# Patient Record
Sex: Female | Born: 1940 | Race: White | Hispanic: No
Health system: Southern US, Community
[De-identification: ages and names within clinical notes are randomized; demographics above are authoritative.]

---

## 2004-12-13 ENCOUNTER — Ambulatory Visit: Payer: Self-pay | Admitting: Internal Medicine

## 2004-12-15 ENCOUNTER — Ambulatory Visit: Payer: Self-pay | Admitting: Internal Medicine

## 2005-06-20 ENCOUNTER — Ambulatory Visit: Payer: Self-pay | Admitting: Internal Medicine

## 2005-09-08 ENCOUNTER — Ambulatory Visit: Payer: Self-pay | Admitting: Otolaryngology

## 2005-12-20 ENCOUNTER — Ambulatory Visit: Payer: Self-pay | Admitting: Internal Medicine

## 2006-12-24 ENCOUNTER — Ambulatory Visit: Payer: Self-pay | Admitting: Internal Medicine

## 2007-02-15 ENCOUNTER — Ambulatory Visit: Payer: Self-pay | Admitting: Gastroenterology

## 2008-02-06 ENCOUNTER — Ambulatory Visit: Payer: Self-pay | Admitting: Internal Medicine

## 2009-05-17 ENCOUNTER — Ambulatory Visit: Payer: Self-pay | Admitting: Internal Medicine

## 2010-09-06 ENCOUNTER — Ambulatory Visit: Payer: Self-pay | Admitting: Internal Medicine

## 2011-11-27 ENCOUNTER — Ambulatory Visit: Payer: Self-pay | Admitting: Internal Medicine

## 2011-11-28 ENCOUNTER — Ambulatory Visit: Payer: Self-pay | Admitting: Internal Medicine

## 2011-12-06 ENCOUNTER — Ambulatory Visit: Payer: Self-pay | Admitting: Surgery

## 2011-12-07 LAB — PATHOLOGY REPORT

## 2011-12-20 ENCOUNTER — Ambulatory Visit: Payer: Self-pay | Admitting: Surgery

## 2012-01-01 ENCOUNTER — Ambulatory Visit: Payer: Self-pay | Admitting: Surgery

## 2012-01-03 LAB — PATHOLOGY REPORT

## 2012-01-19 ENCOUNTER — Ambulatory Visit: Payer: Self-pay | Admitting: Internal Medicine

## 2012-01-19 LAB — CBC CANCER CENTER
Eosinophil %: 3.2 %
HCT: 42.2 % (ref 35.0–47.0)
HGB: 13.9 g/dL (ref 12.0–16.0)
Lymphocyte #: 3.4 x10 3/mm (ref 1.0–3.6)
MCH: 29.4 pg (ref 26.0–34.0)
Monocyte %: 6.3 %
Neutrophil #: 3.8 x10 3/mm (ref 1.4–6.5)
Neutrophil %: 47.3 %
Platelet: 266 x10 3/mm (ref 150–440)
RBC: 4.72 10*6/uL (ref 3.80–5.20)
RDW: 12.8 % (ref 11.5–14.5)

## 2012-01-19 LAB — BASIC METABOLIC PANEL
Chloride: 101 mmol/L (ref 98–107)
Co2: 31 mmol/L (ref 21–32)
Creatinine: 0.84 mg/dL (ref 0.60–1.30)
Osmolality: 279 (ref 275–301)
Potassium: 4.1 mmol/L (ref 3.5–5.1)

## 2012-01-24 LAB — CANCER ANTIGEN 27.29: CA 27.29: 34.2 U/mL (ref 0.0–38.6)

## 2012-02-02 ENCOUNTER — Ambulatory Visit: Payer: Self-pay | Admitting: Surgery

## 2012-02-03 ENCOUNTER — Ambulatory Visit: Payer: Self-pay | Admitting: Internal Medicine

## 2012-02-05 LAB — BASIC METABOLIC PANEL
Anion Gap: 5 — ABNORMAL LOW (ref 7–16)
BUN: 19 mg/dL — ABNORMAL HIGH (ref 7–18)
Calcium, Total: 9.3 mg/dL (ref 8.5–10.1)
Chloride: 104 mmol/L (ref 98–107)
Co2: 31 mmol/L (ref 21–32)
Creatinine: 0.75 mg/dL (ref 0.60–1.30)
EGFR (African American): >60
EGFR (Non-African Amer.): >60
Osmolality: 281 (ref 275–301)
Potassium: 3.9 mmol/L (ref 3.5–5.1)
Sodium: 140 mmol/L (ref 136–145)

## 2012-02-05 LAB — CBC CANCER CENTER
Basophil #: 0 x10 3/mm (ref 0.0–0.1)
HCT: 38.2 % (ref 35.0–47.0)
Lymphocyte #: 2.3 x10 3/mm (ref 1.0–3.6)
MCH: 29.5 pg (ref 26.0–34.0)
MCHC: 33.1 g/dL (ref 32.0–36.0)
MCV: 89 fL (ref 80–100)
Monocyte #: 0.4 x10 3/mm (ref 0.2–0.9)
Monocyte %: 7.7 %
Neutrophil #: 2.6 x10 3/mm (ref 1.4–6.5)
RDW: 13.5 % (ref 11.5–14.5)

## 2012-02-12 LAB — CBC CANCER CENTER
HCT: 36.8 % (ref 35.0–47.0)
Lymphocyte %: 37 %
MCHC: 33.3 g/dL (ref 32.0–36.0)
MCV: 89 fL (ref 80–100)
Monocyte %: 5.8 %
Neutrophil #: 1.3 x10 3/mm — ABNORMAL LOW (ref 1.4–6.5)
Neutrophil %: 45.8 %
RDW: 13.3 % (ref 11.5–14.5)
WBC: 2.9 x10 3/mm — ABNORMAL LOW (ref 3.6–11.0)

## 2012-02-19 LAB — CBC CANCER CENTER
Basophil #: 0.1 x10 3/mm (ref 0.0–0.1)
Basophil %: 1 %
Eosinophil #: 0 x10 3/mm (ref 0.0–0.7)
Eosinophil %: 0.5 %
HCT: 38.7 % (ref 35.0–47.0)
HGB: 12.8 g/dL (ref 12.0–16.0)
Lymphocyte #: 2.6 x10 3/mm (ref 1.0–3.6)
Lymphocyte %: 28.9 %
MCH: 29.3 pg (ref 26.0–34.0)
MCHC: 33 g/dL (ref 32.0–36.0)
MCV: 89 fL (ref 80–100)
Monocyte #: 0.6 x10 3/mm (ref 0.2–0.9)
Monocyte %: 7 %
Neutrophil #: 5.5 x10 3/mm (ref 1.4–6.5)
Neutrophil %: 62.6 %
Platelet: 208 x10 3/mm (ref 150–440)
RBC: 4.36 10*6/uL (ref 3.80–5.20)
RDW: 13 % (ref 11.5–14.5)
WBC: 8.8 x10 3/mm (ref 3.6–11.0)

## 2012-02-19 LAB — HEPATIC FUNCTION PANEL A (ARMC)
Albumin: 3.7 g/dL (ref 3.4–5.0)
Alkaline Phosphatase: 132 U/L (ref 50–136)
Bilirubin, Direct: 0.1 mg/dL (ref 0.00–0.20)
Bilirubin,Total: 0.2 mg/dL (ref 0.2–1.0)
SGOT(AST): 18 U/L (ref 15–37)
SGPT (ALT): 38 U/L
Total Protein: 7.2 g/dL (ref 6.4–8.2)

## 2012-02-19 LAB — BASIC METABOLIC PANEL
BUN: 14 mg/dL (ref 7–18)
Calcium, Total: 9.4 mg/dL (ref 8.5–10.1)
Chloride: 102 mmol/L (ref 98–107)
Co2: 30 mmol/L (ref 21–32)
EGFR (African American): >60
Glucose: 91 mg/dL (ref 65–99)
Osmolality: 276 (ref 275–301)
Potassium: 4.1 mmol/L (ref 3.5–5.1)
Sodium: 138 mmol/L (ref 136–145)

## 2012-02-26 LAB — CBC CANCER CENTER
Basophil %: 2 %
Eosinophil #: 0 x10 3/mm (ref 0.0–0.7)
Eosinophil %: 0.3 %
HCT: 37.6 % (ref 35.0–47.0)
HGB: 12.6 g/dL (ref 12.0–16.0)
Lymphocyte #: 2.3 x10 3/mm (ref 1.0–3.6)
Lymphocyte %: 36.6 %
MCH: 29.6 pg (ref 26.0–34.0)
MCV: 88 fL (ref 80–100)
Monocyte #: 0.6 x10 3/mm (ref 0.2–0.9)
Monocyte %: 10.2 %
Neutrophil #: 3.2 x10 3/mm (ref 1.4–6.5)
Neutrophil %: 50.9 %
Platelet: 344 x10 3/mm (ref 150–440)
RDW: 13.6 % (ref 11.5–14.5)
WBC: 6.3 x10 3/mm (ref 3.6–11.0)

## 2012-03-04 ENCOUNTER — Ambulatory Visit: Payer: Self-pay | Admitting: Internal Medicine

## 2012-03-04 LAB — CBC CANCER CENTER
Basophil #: 0 x10 3/mm (ref 0.0–0.1)
Eosinophil #: 0.1 x10 3/mm (ref 0.0–0.7)
HCT: 34.5 % — ABNORMAL LOW (ref 35.0–47.0)
HGB: 11.4 g/dL — ABNORMAL LOW (ref 12.0–16.0)
Lymphocyte #: 1.1 x10 3/mm (ref 1.0–3.6)
MCH: 29.5 pg (ref 26.0–34.0)
MCHC: 33.1 g/dL (ref 32.0–36.0)
MCV: 89 fL (ref 80–100)
Monocyte #: 0.2 x10 3/mm (ref 0.2–0.9)
Monocyte %: 3.6 %
Neutrophil #: 3.3 x10 3/mm (ref 1.4–6.5)
RDW: 13.6 % (ref 11.5–14.5)
WBC: 4.6 x10 3/mm (ref 3.6–11.0)

## 2012-03-11 LAB — BASIC METABOLIC PANEL
Anion Gap: 6 — ABNORMAL LOW (ref 7–16)
Calcium, Total: 9.6 mg/dL (ref 8.5–10.1)
Chloride: 101 mmol/L (ref 98–107)
Co2: 31 mmol/L (ref 21–32)
Creatinine: 0.92 mg/dL (ref 0.60–1.30)
EGFR (Non-African Amer.): >60
Osmolality: 275 (ref 275–301)
Sodium: 138 mmol/L (ref 136–145)

## 2012-03-11 LAB — HEPATIC FUNCTION PANEL A (ARMC)
Alkaline Phosphatase: 132 U/L (ref 50–136)
Bilirubin, Direct: 0.1 mg/dL (ref 0.00–0.20)
SGPT (ALT): 29 U/L

## 2012-03-11 LAB — CBC CANCER CENTER
Basophil %: 1.2 %
Eosinophil #: 0.1 x10 3/mm (ref 0.0–0.7)
Eosinophil %: 1.7 %
HCT: 36.2 % (ref 35.0–47.0)
HGB: 12.2 g/dL (ref 12.0–16.0)
MCH: 30 pg (ref 26.0–34.0)
MCHC: 33.7 g/dL (ref 32.0–36.0)
Monocyte #: 0.8 x10 3/mm (ref 0.2–0.9)
Neutrophil #: 5.5 x10 3/mm (ref 1.4–6.5)
Neutrophil %: 64.3 %
RBC: 4.08 10*6/uL (ref 3.80–5.20)
RDW: 13.7 % (ref 11.5–14.5)

## 2012-03-18 LAB — CBC CANCER CENTER
Eosinophil %: 0.7 %
HCT: 35.9 % (ref 35.0–47.0)
HGB: 11.9 g/dL — ABNORMAL LOW (ref 12.0–16.0)
Lymphocyte %: 22.8 %
MCV: 90 fL (ref 80–100)
Monocyte #: 0.7 x10 3/mm (ref 0.2–0.9)
Monocyte %: 11.2 %
Neutrophil %: 62.7 %
Platelet: 305 x10 3/mm (ref 150–440)
RBC: 3.98 10*6/uL (ref 3.80–5.20)
WBC: 5.9 x10 3/mm (ref 3.6–11.0)

## 2012-03-25 LAB — CBC CANCER CENTER
Basophil %: 0.5 %
Eosinophil %: 2.3 %
HCT: 34.9 % — ABNORMAL LOW (ref 35.0–47.0)
HGB: 11.5 g/dL — ABNORMAL LOW (ref 12.0–16.0)
Lymphocyte #: 1 x10 3/mm (ref 1.0–3.6)
MCH: 30 pg (ref 26.0–34.0)
MCHC: 32.9 g/dL (ref 32.0–36.0)
Monocyte #: 0.2 x10 3/mm (ref 0.2–0.9)
Monocyte %: 5.2 %
Neutrophil #: 2.9 x10 3/mm (ref 1.4–6.5)
Neutrophil %: 69.3 %
Platelet: 204 x10 3/mm (ref 150–440)
RBC: 3.83 10*6/uL (ref 3.80–5.20)

## 2012-04-03 LAB — CBC CANCER CENTER
Basophil #: 0.2 x10 3/mm — ABNORMAL HIGH (ref 0.0–0.1)
Basophil %: 2.4 %
Eosinophil #: 0.1 x10 3/mm (ref 0.0–0.7)
Eosinophil %: 2 %
HCT: 33.4 % — ABNORMAL LOW (ref 35.0–47.0)
HGB: 11.4 g/dL — ABNORMAL LOW (ref 12.0–16.0)
Lymphocyte #: 1.3 x10 3/mm (ref 1.0–3.6)
MCH: 31.1 pg (ref 26.0–34.0)
MCHC: 34.1 g/dL (ref 32.0–36.0)
MCV: 91 fL (ref 80–100)
Neutrophil #: 4.5 x10 3/mm (ref 1.4–6.5)
RBC: 3.66 10*6/uL — ABNORMAL LOW (ref 3.80–5.20)

## 2012-04-03 LAB — BASIC METABOLIC PANEL
BUN: 12 mg/dL (ref 7–18)
Calcium, Total: 9.1 mg/dL (ref 8.5–10.1)
Chloride: 103 mmol/L (ref 98–107)
Co2: 31 mmol/L (ref 21–32)
EGFR (African American): >60
Glucose: 90 mg/dL (ref 65–99)
Osmolality: 281 (ref 275–301)
Potassium: 4 mmol/L (ref 3.5–5.1)
Sodium: 141 mmol/L (ref 136–145)

## 2012-04-03 LAB — HEPATIC FUNCTION PANEL A (ARMC)
Alkaline Phosphatase: 109 U/L (ref 50–136)
Bilirubin, Direct: 0.1 mg/dL (ref 0.00–0.20)
Bilirubin,Total: 0.3 mg/dL (ref 0.2–1.0)
Total Protein: 6.6 g/dL (ref 6.4–8.2)

## 2012-04-04 ENCOUNTER — Ambulatory Visit: Payer: Self-pay | Admitting: Internal Medicine

## 2012-04-08 LAB — CBC CANCER CENTER
Basophil #: 0.2 x10 3/mm — ABNORMAL HIGH (ref 0.0–0.1)
Eosinophil #: 0 x10 3/mm (ref 0.0–0.7)
Eosinophil %: 0.9 %
Lymphocyte #: 1.4 x10 3/mm (ref 1.0–3.6)
Lymphocyte %: 28.7 %
MCH: 31.5 pg (ref 26.0–34.0)
MCHC: 34.5 g/dL (ref 32.0–36.0)
MCV: 91 fL (ref 80–100)
Monocyte #: 0.5 x10 3/mm (ref 0.2–0.9)
Platelet: 350 x10 3/mm (ref 150–440)
RDW: 17.4 % — ABNORMAL HIGH (ref 11.5–14.5)

## 2012-04-08 LAB — COMPREHENSIVE METABOLIC PANEL
Alkaline Phosphatase: 97 U/L (ref 50–136)
BUN: 18 mg/dL (ref 7–18)
Calcium, Total: 9.3 mg/dL (ref 8.5–10.1)
Co2: 28 mmol/L (ref 21–32)
EGFR (Non-African Amer.): >60
Glucose: 76 mg/dL (ref 65–99)
Osmolality: 280 (ref 275–301)
Sodium: 140 mmol/L (ref 136–145)

## 2012-04-15 LAB — CBC CANCER CENTER
Basophil #: 0 x10 3/mm (ref 0.0–0.1)
Eosinophil #: 0.1 x10 3/mm (ref 0.0–0.7)
Eosinophil %: 2.6 %
HCT: 31.6 % — ABNORMAL LOW (ref 35.0–47.0)
Lymphocyte #: 0.9 x10 3/mm — ABNORMAL LOW (ref 1.0–3.6)
MCHC: 34.6 g/dL (ref 32.0–36.0)
MCV: 92 fL (ref 80–100)
Neutrophil #: 3.6 x10 3/mm (ref 1.4–6.5)
Platelet: 186 x10 3/mm (ref 150–440)
RDW: 16.7 % — ABNORMAL HIGH (ref 11.5–14.5)

## 2012-04-22 LAB — CBC CANCER CENTER
Basophil #: 0.1 x10 3/mm (ref 0.0–0.1)
Basophil %: 1.5 %
Eosinophil #: 0.2 x10 3/mm (ref 0.0–0.7)
HCT: 34.7 % — ABNORMAL LOW (ref 35.0–47.0)
Lymphocyte #: 1.5 x10 3/mm (ref 1.0–3.6)
Lymphocyte %: 17.1 %
MCH: 30.6 pg (ref 26.0–34.0)
MCHC: 32.8 g/dL (ref 32.0–36.0)
MCV: 93 fL (ref 80–100)
Monocyte #: 0.9 x10 3/mm (ref 0.2–0.9)
Neutrophil #: 6 x10 3/mm (ref 1.4–6.5)
Platelet: 245 x10 3/mm (ref 150–440)
RBC: 3.72 10*6/uL — ABNORMAL LOW (ref 3.80–5.20)
RDW: 17.4 % — ABNORMAL HIGH (ref 11.5–14.5)

## 2012-04-22 LAB — HEPATIC FUNCTION PANEL A (ARMC)
Albumin: 3.7 g/dL (ref 3.4–5.0)
Alkaline Phosphatase: 128 U/L (ref 50–136)
SGOT(AST): 11 U/L — ABNORMAL LOW (ref 15–37)
SGPT (ALT): 30 U/L (ref 12–78)
Total Protein: 6.9 g/dL (ref 6.4–8.2)

## 2012-04-22 LAB — BASIC METABOLIC PANEL
Anion Gap: 5 — ABNORMAL LOW (ref 7–16)
BUN: 13 mg/dL (ref 7–18)
Co2: 33 mmol/L — ABNORMAL HIGH (ref 21–32)
EGFR (African American): >60
EGFR (Non-African Amer.): >60
Osmolality: 279 (ref 275–301)
Sodium: 140 mmol/L (ref 136–145)

## 2012-04-29 LAB — CBC CANCER CENTER
Basophil #: 0 x10 3/mm (ref 0.0–0.1)
Eosinophil #: 0 x10 3/mm (ref 0.0–0.7)
HGB: 11.8 g/dL — ABNORMAL LOW (ref 12.0–16.0)
Lymphocyte %: 9.5 %
MCH: 31.4 pg (ref 26.0–34.0)
MCHC: 33.7 g/dL (ref 32.0–36.0)
Monocyte %: 0.5 %
Neutrophil #: 5.2 x10 3/mm (ref 1.4–6.5)
Neutrophil %: 89.7 %
Platelet: 353 x10 3/mm (ref 150–440)
RDW: 17.6 % — ABNORMAL HIGH (ref 11.5–14.5)
WBC: 5.7 x10 3/mm (ref 3.6–11.0)

## 2012-05-05 ENCOUNTER — Ambulatory Visit: Payer: Self-pay | Admitting: Internal Medicine

## 2012-05-07 LAB — CBC CANCER CENTER
Basophil #: 0 x10 3/mm (ref 0.0–0.1)
HCT: 34.2 % — ABNORMAL LOW (ref 35.0–47.0)
HGB: 11.3 g/dL — ABNORMAL LOW (ref 12.0–16.0)
Lymphocyte #: 0.6 x10 3/mm — ABNORMAL LOW (ref 1.0–3.6)
Lymphocyte %: 7.1 %
Monocyte %: 1.1 %
Neutrophil #: 7.5 x10 3/mm — ABNORMAL HIGH (ref 1.4–6.5)
Platelet: 294 x10 3/mm (ref 150–440)
RDW: 16.5 % — ABNORMAL HIGH (ref 11.5–14.5)
WBC: 8.2 x10 3/mm (ref 3.6–11.0)

## 2012-05-13 LAB — BASIC METABOLIC PANEL
Anion Gap: 7 (ref 7–16)
Calcium, Total: 9.3 mg/dL (ref 8.5–10.1)
Co2: 29 mmol/L (ref 21–32)
Creatinine: 0.98 mg/dL (ref 0.60–1.30)
EGFR (African American): >60
EGFR (Non-African Amer.): 58 — ABNORMAL LOW
Osmolality: 277 (ref 275–301)
Sodium: 136 mmol/L (ref 136–145)

## 2012-05-13 LAB — CBC CANCER CENTER
Basophil #: 0 x10 3/mm (ref 0.0–0.1)
Basophil %: 0.1 %
Eosinophil #: 0 x10 3/mm (ref 0.0–0.7)
Lymphocyte %: 9.6 %
MCH: 31.6 pg (ref 26.0–34.0)
MCHC: 33.5 g/dL (ref 32.0–36.0)
Monocyte #: 0 x10 3/mm — ABNORMAL LOW (ref 0.2–0.9)
Neutrophil #: 4.7 x10 3/mm (ref 1.4–6.5)
Neutrophil %: 89.5 %
Platelet: 258 x10 3/mm (ref 150–440)
RDW: 15.5 % — ABNORMAL HIGH (ref 11.5–14.5)
WBC: 5.2 x10 3/mm (ref 3.6–11.0)

## 2012-05-13 LAB — HEPATIC FUNCTION PANEL A (ARMC)
Alkaline Phosphatase: 78 U/L (ref 50–136)
Bilirubin,Total: 0.5 mg/dL (ref 0.2–1.0)
SGPT (ALT): 38 U/L (ref 12–78)
Total Protein: 6.8 g/dL (ref 6.4–8.2)

## 2012-05-20 LAB — CBC CANCER CENTER
Basophil %: 0.1 %
Eosinophil %: 0 %
HCT: 31.6 % — ABNORMAL LOW (ref 35.0–47.0)
Lymphocyte %: 8 %
MCV: 95 fL (ref 80–100)
Monocyte %: 0.8 %
Neutrophil %: 91.1 %
RBC: 3.32 10*6/uL — ABNORMAL LOW (ref 3.80–5.20)
WBC: 6.3 x10 3/mm (ref 3.6–11.0)

## 2012-05-27 LAB — CBC CANCER CENTER
Basophil #: 0 x10 3/mm (ref 0.0–0.1)
Basophil %: 0.1 %
Eosinophil %: 0 %
HCT: 32.8 % — ABNORMAL LOW (ref 35.0–47.0)
HGB: 11 g/dL — ABNORMAL LOW (ref 12.0–16.0)
Lymphocyte #: 0.5 x10 3/mm — ABNORMAL LOW (ref 1.0–3.6)
MCH: 32.1 pg (ref 26.0–34.0)
MCHC: 33.5 g/dL (ref 32.0–36.0)
MCV: 96 fL (ref 80–100)
RBC: 3.42 10*6/uL — ABNORMAL LOW (ref 3.80–5.20)

## 2012-06-04 ENCOUNTER — Ambulatory Visit: Payer: Self-pay | Admitting: Internal Medicine

## 2012-06-06 LAB — CBC CANCER CENTER
Basophil #: 0 x10 3/mm (ref 0.0–0.1)
Eosinophil #: 0 x10 3/mm (ref 0.0–0.7)
Eosinophil %: 0 %
HCT: 32.7 % — ABNORMAL LOW (ref 35.0–47.0)
HGB: 10.9 g/dL — ABNORMAL LOW (ref 12.0–16.0)
Lymphocyte #: 0.5 x10 3/mm — ABNORMAL LOW (ref 1.0–3.6)
Lymphocyte %: 9.7 %
Monocyte %: 1.1 %
Neutrophil #: 4.8 x10 3/mm (ref 1.4–6.5)
Platelet: 302 x10 3/mm (ref 150–440)
WBC: 5.4 x10 3/mm (ref 3.6–11.0)

## 2012-06-17 LAB — CBC CANCER CENTER
Basophil #: 0 x10 3/mm (ref 0.0–0.1)
Eosinophil #: 0 x10 3/mm (ref 0.0–0.7)
HCT: 33.8 % — ABNORMAL LOW (ref 35.0–47.0)
Lymphocyte #: 0.7 x10 3/mm — ABNORMAL LOW (ref 1.0–3.6)
Lymphocyte %: 8.9 %
MCHC: 33.6 g/dL (ref 32.0–36.0)
MCV: 95 fL (ref 80–100)
Monocyte #: 0.1 x10 3/mm — ABNORMAL LOW (ref 0.2–0.9)
Neutrophil #: 6.8 x10 3/mm — ABNORMAL HIGH (ref 1.4–6.5)
Neutrophil %: 90.1 %
RDW: 14.2 % (ref 11.5–14.5)

## 2012-06-17 LAB — BASIC METABOLIC PANEL
Anion Gap: 9 (ref 7–16)
BUN: 18 mg/dL (ref 7–18)
Creatinine: 0.93 mg/dL (ref 0.60–1.30)
EGFR (African American): >60
EGFR (Non-African Amer.): >60
Glucose: 143 mg/dL — ABNORMAL HIGH (ref 65–99)
Potassium: 3.7 mmol/L (ref 3.5–5.1)
Sodium: 138 mmol/L (ref 136–145)

## 2012-06-17 LAB — HEPATIC FUNCTION PANEL A (ARMC)
Albumin: 3.5 g/dL (ref 3.4–5.0)
Alkaline Phosphatase: 82 U/L (ref 50–136)
Bilirubin, Direct: 0.1 mg/dL (ref 0.00–0.20)
Bilirubin,Total: 0.4 mg/dL (ref 0.2–1.0)

## 2012-06-24 LAB — CBC CANCER CENTER
Basophil %: 0.1 %
Eosinophil %: 0 %
Lymphocyte #: 0.7 x10 3/mm — ABNORMAL LOW (ref 1.0–3.6)
Lymphocyte %: 9.7 %
MCHC: 32.9 g/dL (ref 32.0–36.0)
Monocyte #: 0.1 x10 3/mm — ABNORMAL LOW (ref 0.2–0.9)
Monocyte %: 0.9 %
Neutrophil #: 6.2 x10 3/mm (ref 1.4–6.5)
Platelet: 299 x10 3/mm (ref 150–440)
RDW: 14.1 % (ref 11.5–14.5)
WBC: 7 x10 3/mm (ref 3.6–11.0)

## 2012-07-01 LAB — CBC CANCER CENTER
Basophil %: 0.2 %
Eosinophil #: 0 x10 3/mm (ref 0.0–0.7)
Eosinophil %: 0 %
HCT: 35.1 % (ref 35.0–47.0)
HGB: 11.7 g/dL — ABNORMAL LOW (ref 12.0–16.0)
Lymphocyte #: 0.6 x10 3/mm — ABNORMAL LOW (ref 1.0–3.6)
MCH: 31.1 pg (ref 26.0–34.0)
MCHC: 33.4 g/dL (ref 32.0–36.0)
MCV: 93 fL (ref 80–100)
Monocyte #: 0.1 x10 3/mm — ABNORMAL LOW (ref 0.2–0.9)
Neutrophil #: 4.8 x10 3/mm (ref 1.4–6.5)
Neutrophil %: 86.5 %
Platelet: 323 x10 3/mm (ref 150–440)
RBC: 3.77 10*6/uL — ABNORMAL LOW (ref 3.80–5.20)
RDW: 14.1 % (ref 11.5–14.5)

## 2012-07-02 ENCOUNTER — Other Ambulatory Visit: Payer: Self-pay | Admitting: Podiatry

## 2012-07-05 ENCOUNTER — Ambulatory Visit: Payer: Self-pay | Admitting: Internal Medicine

## 2012-07-08 LAB — CBC CANCER CENTER
Basophil #: 0.1 x10 3/mm (ref 0.0–0.1)
Basophil %: 1.8 %
Eosinophil %: 3.4 %
HCT: 32.5 % — ABNORMAL LOW (ref 35.0–47.0)
HGB: 10.7 g/dL — ABNORMAL LOW (ref 12.0–16.0)
Lymphocyte %: 31 %
MCH: 31.1 pg (ref 26.0–34.0)
MCHC: 32.9 g/dL (ref 32.0–36.0)
Monocyte #: 0.7 x10 3/mm (ref 0.2–0.9)
Monocyte %: 15.3 %
Neutrophil %: 48.5 %
Platelet: 278 x10 3/mm (ref 150–440)
RBC: 3.44 10*6/uL — ABNORMAL LOW (ref 3.80–5.20)
WBC: 4.7 x10 3/mm (ref 3.6–11.0)

## 2012-07-08 LAB — BASIC METABOLIC PANEL
Chloride: 102 mmol/L (ref 98–107)
Co2: 27 mmol/L (ref 21–32)
Creatinine: 0.91 mg/dL (ref 0.60–1.30)
Osmolality: 277 (ref 275–301)
Sodium: 138 mmol/L (ref 136–145)

## 2012-07-08 LAB — HEPATIC FUNCTION PANEL A (ARMC)
Albumin: 3.1 g/dL — ABNORMAL LOW (ref 3.4–5.0)
Alkaline Phosphatase: 80 U/L (ref 50–136)
Bilirubin, Direct: <0.05 mg/dL (ref 0.00–0.20)
Bilirubin,Total: 0.3 mg/dL (ref 0.2–1.0)

## 2012-07-15 LAB — CBC CANCER CENTER
HCT: 33.5 % — ABNORMAL LOW (ref 35.0–47.0)
HGB: 11.1 g/dL — ABNORMAL LOW (ref 12.0–16.0)
Lymphocyte #: 1.7 x10 3/mm (ref 1.0–3.6)
Lymphocyte %: 23.8 %
Monocyte #: 0.4 x10 3/mm (ref 0.2–0.9)
Monocyte %: 6.2 %
Neutrophil #: 4.8 x10 3/mm (ref 1.4–6.5)
Platelet: 289 x10 3/mm (ref 150–440)
RDW: 14.3 % (ref 11.5–14.5)
WBC: 7.2 x10 3/mm (ref 3.6–11.0)

## 2012-07-22 LAB — CBC CANCER CENTER
Basophil #: 0.1 x10 3/mm (ref 0.0–0.1)
Eosinophil #: 0.2 x10 3/mm (ref 0.0–0.7)
HCT: 31.4 % — ABNORMAL LOW (ref 35.0–47.0)
Lymphocyte #: 1.5 x10 3/mm (ref 1.0–3.6)
MCH: 31.1 pg (ref 26.0–34.0)
MCHC: 33.5 g/dL (ref 32.0–36.0)
MCV: 93 fL (ref 80–100)
Monocyte #: 0.3 x10 3/mm (ref 0.2–0.9)
Monocyte %: 5.9 %
Platelet: 250 x10 3/mm (ref 150–440)
RBC: 3.38 10*6/uL — ABNORMAL LOW (ref 3.80–5.20)
RDW: 14.7 % — ABNORMAL HIGH (ref 11.5–14.5)
WBC: 4.9 x10 3/mm (ref 3.6–11.0)

## 2012-07-29 LAB — BASIC METABOLIC PANEL
Anion Gap: 8 (ref 7–16)
BUN: 17 mg/dL (ref 7–18)
Chloride: 102 mmol/L (ref 98–107)
Co2: 29 mmol/L (ref 21–32)
Creatinine: 0.92 mg/dL (ref 0.60–1.30)
Potassium: 3.9 mmol/L (ref 3.5–5.1)
Sodium: 139 mmol/L (ref 136–145)

## 2012-07-29 LAB — CBC CANCER CENTER
Basophil %: 1.4 %
Eosinophil %: 2.1 %
HCT: 32 % — ABNORMAL LOW (ref 35.0–47.0)
HGB: 10.7 g/dL — ABNORMAL LOW (ref 12.0–16.0)
Lymphocyte #: 1.5 x10 3/mm (ref 1.0–3.6)
Lymphocyte %: 32.4 %
MCHC: 33.4 g/dL (ref 32.0–36.0)
Monocyte #: 0.3 x10 3/mm (ref 0.2–0.9)
Monocyte %: 7.1 %
Neutrophil #: 2.6 x10 3/mm (ref 1.4–6.5)
Neutrophil %: 57 %
Platelet: 289 x10 3/mm (ref 150–440)
RDW: 14.7 % — ABNORMAL HIGH (ref 11.5–14.5)
WBC: 4.5 x10 3/mm (ref 3.6–11.0)

## 2012-07-29 LAB — HEPATIC FUNCTION PANEL A (ARMC)
Albumin: 3.5 g/dL (ref 3.4–5.0)
Alkaline Phosphatase: 80 U/L (ref 50–136)
Bilirubin,Total: 0.4 mg/dL (ref 0.2–1.0)
SGPT (ALT): 36 U/L (ref 12–78)

## 2012-08-04 ENCOUNTER — Ambulatory Visit: Payer: Self-pay | Admitting: Internal Medicine

## 2012-09-04 ENCOUNTER — Ambulatory Visit: Payer: Self-pay | Admitting: Internal Medicine

## 2012-09-16 LAB — CBC CANCER CENTER
Basophil %: 1 %
HGB: 12.9 g/dL (ref 12.0–16.0)
MCHC: 33.6 g/dL (ref 32.0–36.0)
MCV: 89 fL (ref 80–100)
Monocyte #: 0.5 x10 3/mm (ref 0.2–0.9)
Neutrophil %: 61.1 %
Platelet: 231 x10 3/mm (ref 150–440)
RBC: 4.31 10*6/uL (ref 3.80–5.20)
RDW: 14.3 % (ref 11.5–14.5)
WBC: 6.4 x10 3/mm (ref 3.6–11.0)

## 2012-09-23 LAB — CBC CANCER CENTER
Basophil #: 0.1 x10 3/mm (ref 0.0–0.1)
Basophil %: 1 %
HGB: 12.9 g/dL (ref 12.0–16.0)
Lymphocyte #: 1.7 x10 3/mm (ref 1.0–3.6)
Lymphocyte %: 30.8 %
MCH: 30.1 pg (ref 26.0–34.0)
Monocyte #: 0.5 x10 3/mm (ref 0.2–0.9)
Monocyte %: 9.4 %
Neutrophil %: 56.6 %
Platelet: 209 x10 3/mm (ref 150–440)
RDW: 14.3 % (ref 11.5–14.5)
WBC: 5.5 x10 3/mm (ref 3.6–11.0)

## 2012-09-30 LAB — CBC CANCER CENTER
HGB: 13 g/dL (ref 12.0–16.0)
MCV: 88 fL (ref 80–100)
Monocyte #: 0.5 x10 3/mm (ref 0.2–0.9)
Neutrophil #: 3.8 x10 3/mm (ref 1.4–6.5)
Neutrophil %: 63 %
Platelet: 221 x10 3/mm (ref 150–440)
RBC: 4.37 10*6/uL (ref 3.80–5.20)
WBC: 6 x10 3/mm (ref 3.6–11.0)

## 2012-10-05 ENCOUNTER — Ambulatory Visit: Payer: Self-pay | Admitting: Internal Medicine

## 2012-10-07 LAB — CBC CANCER CENTER
Eosinophil #: 0.1 x10 3/mm (ref 0.0–0.7)
HCT: 37.7 % (ref 35.0–47.0)
HGB: 12.9 g/dL (ref 12.0–16.0)
Lymphocyte #: 1.3 x10 3/mm (ref 1.0–3.6)
Lymphocyte %: 25 %
MCH: 29.8 pg (ref 26.0–34.0)
MCHC: 34.1 g/dL (ref 32.0–36.0)
MCV: 87 fL (ref 80–100)
Monocyte #: 0.6 x10 3/mm (ref 0.2–0.9)
Monocyte %: 10.8 %
Neutrophil #: 3.2 x10 3/mm (ref 1.4–6.5)
Neutrophil %: 61.3 %
Platelet: 215 x10 3/mm (ref 150–440)
RBC: 4.32 10*6/uL (ref 3.80–5.20)
RDW: 14.3 % (ref 11.5–14.5)
WBC: 5.3 x10 3/mm (ref 3.6–11.0)

## 2012-10-14 LAB — CBC CANCER CENTER
Basophil #: 0.1 x10 3/mm (ref 0.0–0.1)
Basophil %: 1.1 %
Eosinophil #: 0.1 x10 3/mm (ref 0.0–0.7)
Eosinophil %: 2.7 %
HGB: 12.9 g/dL (ref 12.0–16.0)
Lymphocyte %: 25.2 %
MCH: 29.6 pg (ref 26.0–34.0)
MCHC: 33.9 g/dL (ref 32.0–36.0)
MCV: 87 fL (ref 80–100)
Monocyte #: 0.5 x10 3/mm (ref 0.2–0.9)
Monocyte %: 9.3 %
Neutrophil #: 3.1 x10 3/mm (ref 1.4–6.5)
Neutrophil %: 61.7 %
WBC: 5 x10 3/mm (ref 3.6–11.0)

## 2012-10-21 LAB — CBC CANCER CENTER
Eosinophil #: 0.1 x10 3/mm (ref 0.0–0.7)
HGB: 12.3 g/dL (ref 12.0–16.0)
Lymphocyte #: 1.2 x10 3/mm (ref 1.0–3.6)
MCHC: 33.3 g/dL (ref 32.0–36.0)
MCV: 88 fL (ref 80–100)
Monocyte #: 0.5 x10 3/mm (ref 0.2–0.9)
Monocyte %: 11.5 %
Platelet: 199 x10 3/mm (ref 150–440)
RDW: 13.5 % (ref 11.5–14.5)

## 2012-10-21 LAB — BASIC METABOLIC PANEL
Anion Gap: 8 (ref 7–16)
BUN: 15 mg/dL (ref 7–18)
Chloride: 103 mmol/L (ref 98–107)
Co2: 30 mmol/L (ref 21–32)
EGFR (African American): >60
Glucose: 100 mg/dL — ABNORMAL HIGH (ref 65–99)
Osmolality: 282 (ref 275–301)
Potassium: 4 mmol/L (ref 3.5–5.1)
Sodium: 141 mmol/L (ref 136–145)

## 2012-10-21 LAB — HEPATIC FUNCTION PANEL A (ARMC)
Alkaline Phosphatase: 89 U/L (ref 50–136)
Bilirubin, Direct: 0.1 mg/dL (ref 0.00–0.20)
Bilirubin,Total: 0.4 mg/dL (ref 0.2–1.0)
SGOT(AST): 24 U/L (ref 15–37)
Total Protein: 6.6 g/dL (ref 6.4–8.2)

## 2012-10-22 LAB — CANCER ANTIGEN 27.29: CA 27.29: 30.4 U/mL (ref 0.0–38.6)

## 2012-11-02 ENCOUNTER — Ambulatory Visit: Payer: Self-pay | Admitting: Internal Medicine

## 2012-11-28 ENCOUNTER — Ambulatory Visit: Payer: Self-pay | Admitting: Surgery

## 2012-12-03 ENCOUNTER — Ambulatory Visit: Payer: Self-pay | Admitting: Internal Medicine

## 2013-01-03 ENCOUNTER — Ambulatory Visit: Payer: Self-pay | Admitting: Internal Medicine

## 2013-02-02 ENCOUNTER — Ambulatory Visit: Payer: Self-pay | Admitting: Internal Medicine

## 2013-02-24 LAB — CBC CANCER CENTER
Basophil #: 0 x10 3/mm (ref 0.0–0.1)
Basophil %: 0.7 %
HGB: 13.4 g/dL (ref 12.0–16.0)
Lymphocyte #: 1.7 x10 3/mm (ref 1.0–3.6)
Lymphocyte %: 28 %
MCH: 31.1 pg (ref 26.0–34.0)
Monocyte %: 7.1 %
Neutrophil #: 3.8 x10 3/mm (ref 1.4–6.5)
Neutrophil %: 62.7 %
RDW: 13.6 % (ref 11.5–14.5)
WBC: 6.1 x10 3/mm (ref 3.6–11.0)

## 2013-02-24 LAB — HEPATIC FUNCTION PANEL A (ARMC)
Albumin: 3.7 g/dL (ref 3.4–5.0)
Bilirubin, Direct: 0.1 mg/dL (ref 0.00–0.20)
SGOT(AST): 23 U/L (ref 15–37)
Total Protein: 7.3 g/dL (ref 6.4–8.2)

## 2013-02-24 LAB — CREATININE, SERUM
Creatinine: 1.16 mg/dL (ref 0.60–1.30)
EGFR (African American): 54 — ABNORMAL LOW
EGFR (Non-African Amer.): 47 — ABNORMAL LOW

## 2013-02-25 LAB — CANCER ANTIGEN 27.29: CA 27.29: 32.6 U/mL (ref 0.0–38.6)

## 2013-03-04 ENCOUNTER — Ambulatory Visit: Payer: Self-pay | Admitting: Internal Medicine

## 2013-04-04 ENCOUNTER — Ambulatory Visit: Payer: Self-pay | Admitting: Internal Medicine

## 2013-05-05 ENCOUNTER — Ambulatory Visit: Payer: Self-pay | Admitting: Internal Medicine

## 2013-05-06 IMAGING — MG MM CAD DIAGNOSTIC MAMMO
1 series · 6 of 6 positions shown · non-contrast
Comparison: none

REASON FOR EXAM: HX BRST CA
COMMENTS:

[R CC · right · 6 of 6 slices shown]
[im 1/6]
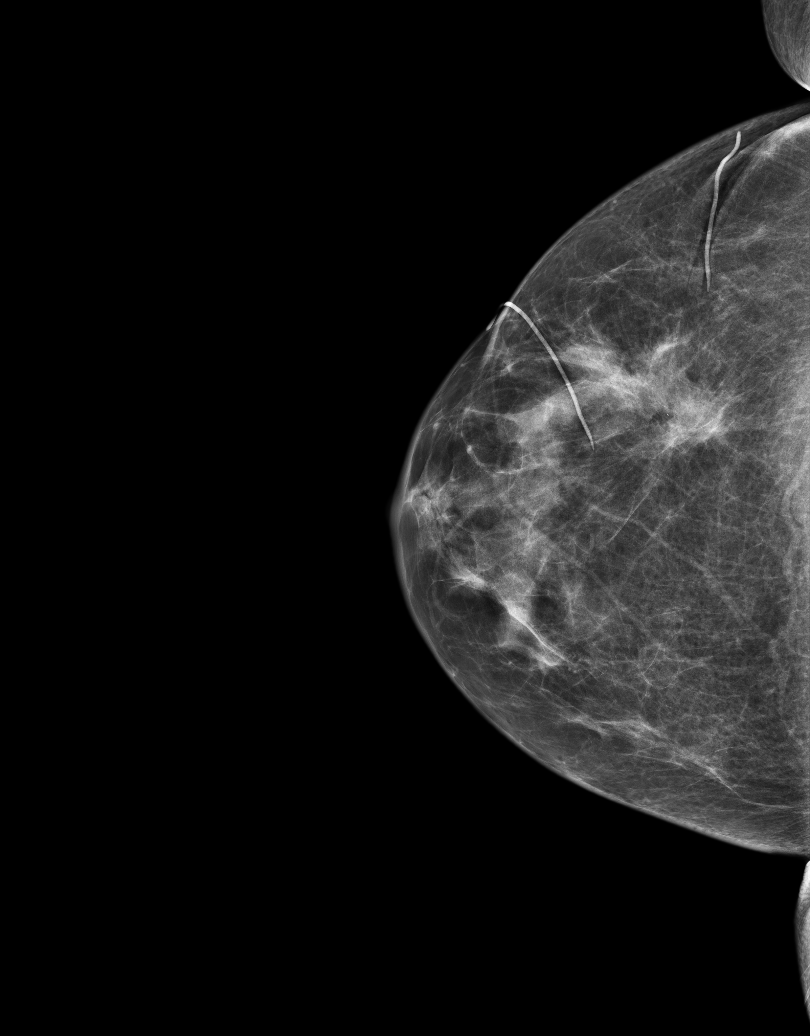
[im 2/6]
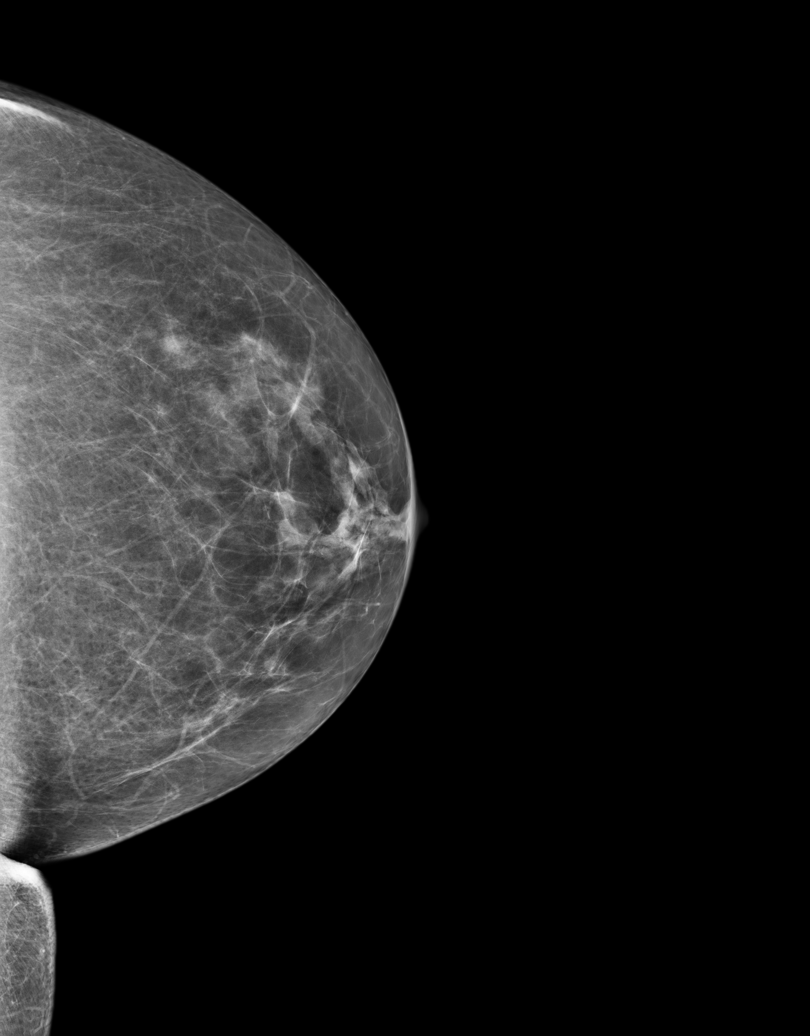
[im 3/6]
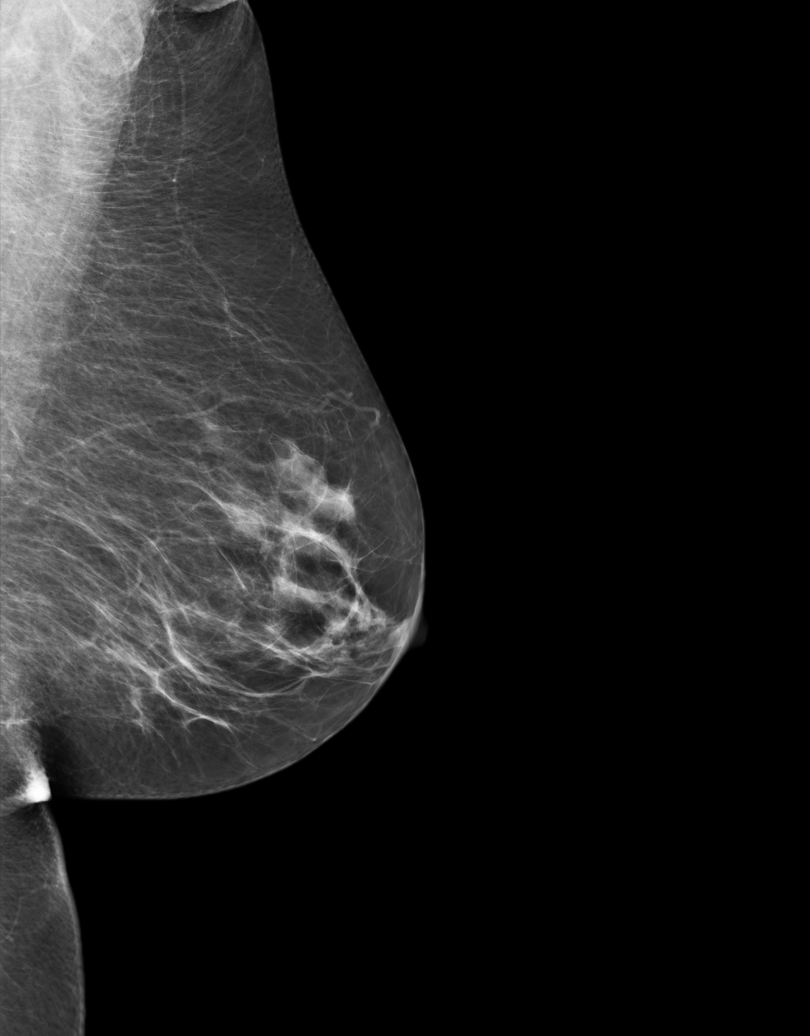
[im 4/6]
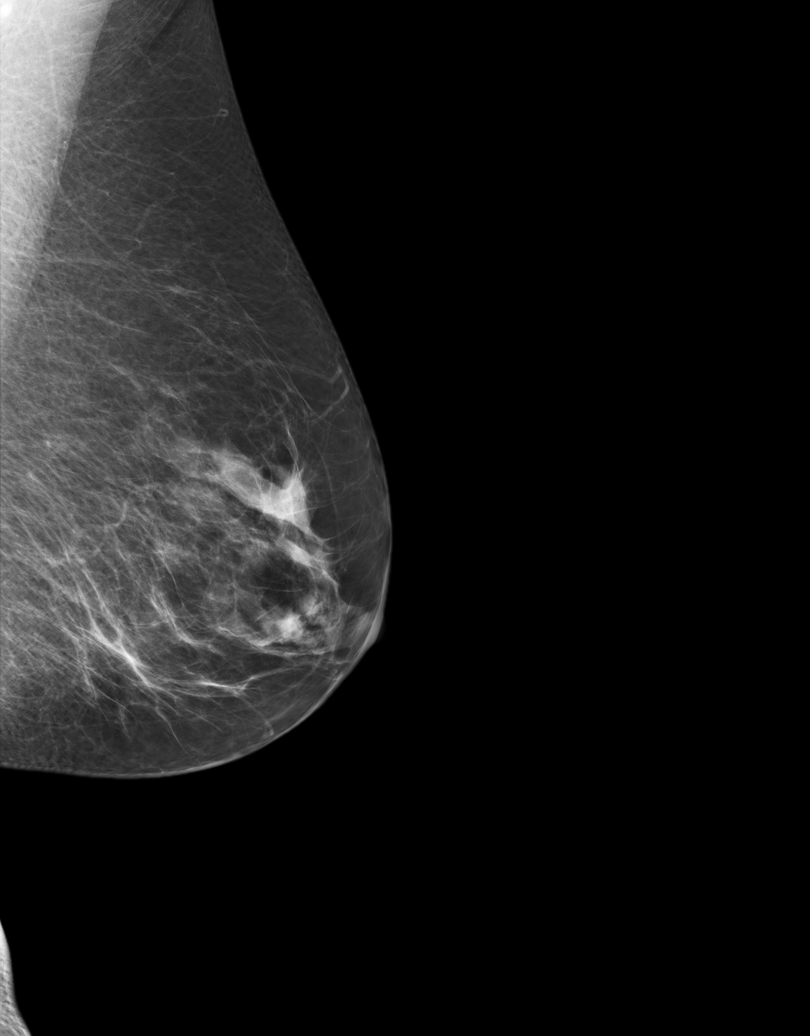
[im 5/6]
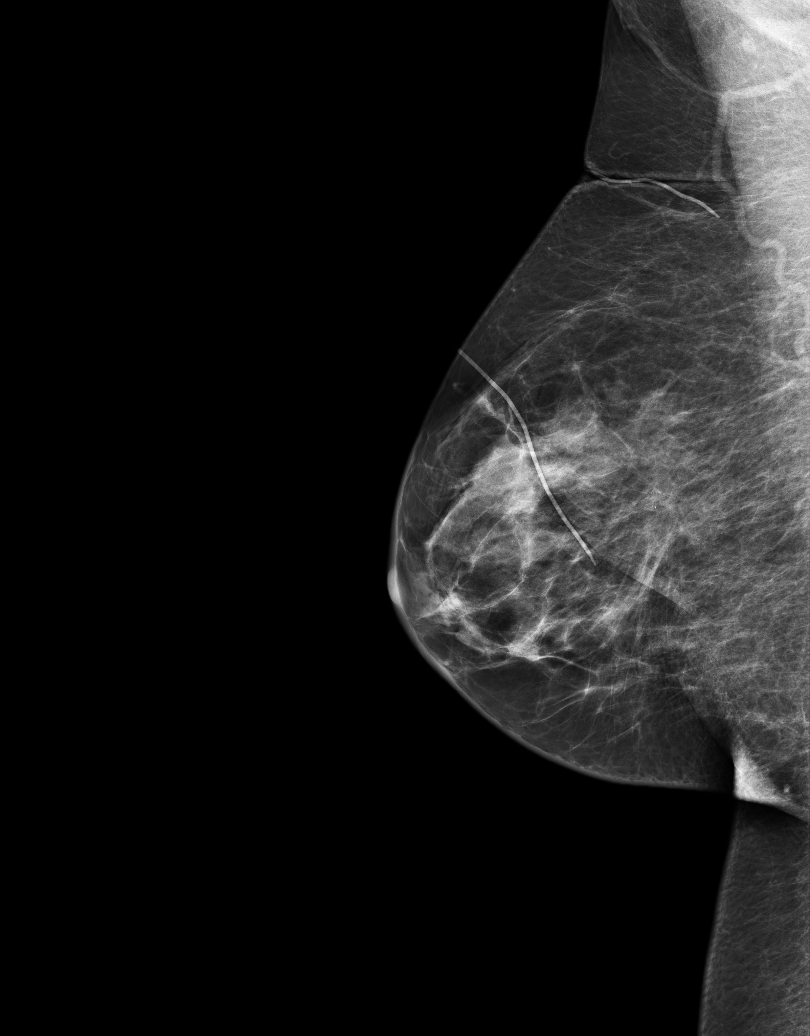
[im 6/6]
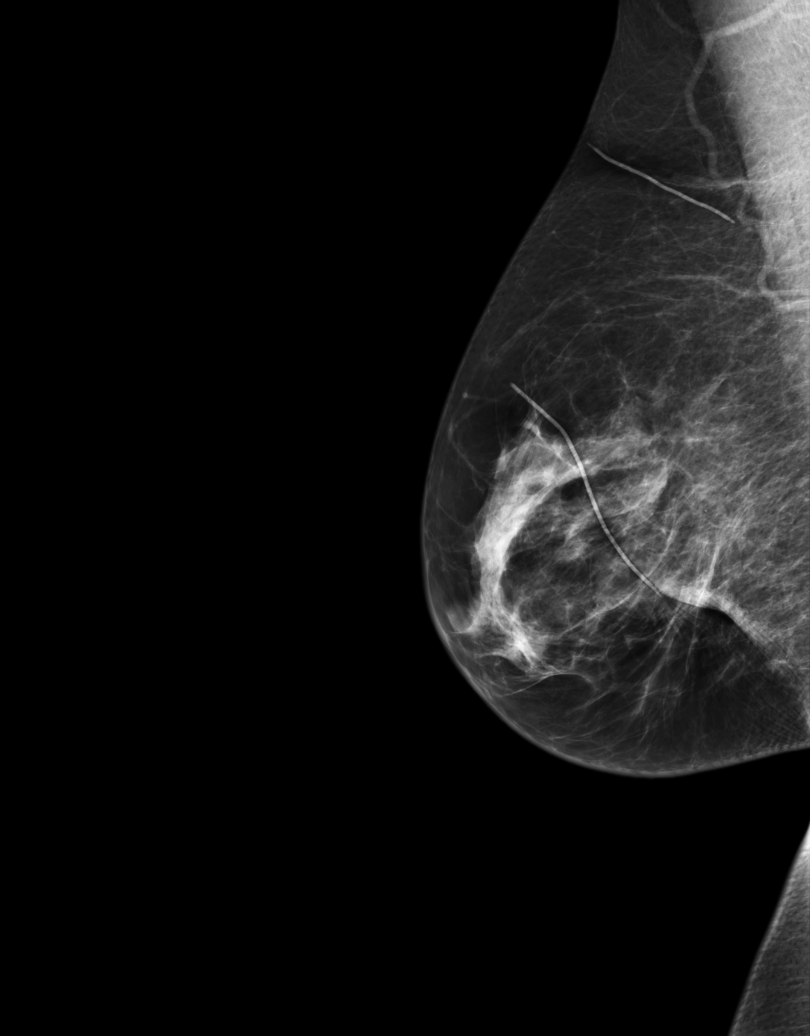

[6 of 6 positions shown; findings below may reference images not displayed]

PROCEDURE:     MAM - MAM DGTL DIAGNOSTIC MAMMO W/CAD  - November 28, 2012 [DATE]

RESULT:     Comparison made to multiple prior studies dating to 12/07/2003.
Changes in the upper outer aspect of the right breast are most consistent
with scarring. No mass lesion. Previously identified lesion has been
removed. Benign calcifications. CAD evaluation nonfocal.
IMPRESSION: Findings most consistent with scarring right breast.
Continued close yearly followup mammogram suggested.

BI-RADS: BI-RADS: Category 2- Benign Finding

A NEGATIVE MAMMOGRAM REPORT DOES NOT PRECLUDE BIOPSY OR OTHER EVALUATION OF
A CLINICALLY PALPABLE OR OTHERWISE SUSPICIOUS MASS OR LESION. BREAST CANCER
MAY NOT BE DETECTED IN UP TO 10% OF CASES.

Thank you for the oppurtunity to contribute to the care of your patient. .

BREAST COMPOSITION: The breast composition is SCATTERED FIBROGLANDULAR
TISSUE (glandular tissue is 25-50%)

## 2013-05-19 LAB — CBC CANCER CENTER
Basophil %: 0.8 %
HCT: 38.5 % (ref 35.0–47.0)
Lymphocyte #: 2.1 x10 3/mm (ref 1.0–3.6)
MCH: 30.1 pg (ref 26.0–34.0)
MCHC: 33.7 g/dL (ref 32.0–36.0)
MCV: 89 fL (ref 80–100)
Monocyte #: 0.5 x10 3/mm (ref 0.2–0.9)
Neutrophil #: 3.5 x10 3/mm (ref 1.4–6.5)
Neutrophil %: 56.2 %
RBC: 4.31 10*6/uL (ref 3.80–5.20)

## 2013-05-19 LAB — HEPATIC FUNCTION PANEL A (ARMC)
Albumin: 3.7 g/dL (ref 3.4–5.0)
Alkaline Phosphatase: 84 U/L (ref 50–136)
Bilirubin, Direct: 0.1 mg/dL (ref 0.00–0.20)
Bilirubin,Total: 0.5 mg/dL (ref 0.2–1.0)
SGOT(AST): 21 U/L (ref 15–37)
Total Protein: 6.9 g/dL (ref 6.4–8.2)

## 2013-05-19 LAB — CREATININE, SERUM
Creatinine: 0.9 mg/dL (ref 0.60–1.30)
EGFR (African American): >60
EGFR (Non-African Amer.): >60

## 2013-05-20 LAB — CANCER ANTIGEN 27.29: CA 27.29: 25.1 U/mL (ref 0.0–38.6)

## 2013-06-04 ENCOUNTER — Ambulatory Visit: Payer: Self-pay | Admitting: Internal Medicine

## 2013-07-05 ENCOUNTER — Ambulatory Visit: Payer: Self-pay | Admitting: Internal Medicine

## 2013-08-11 ENCOUNTER — Ambulatory Visit: Payer: Self-pay | Admitting: Internal Medicine

## 2013-09-04 ENCOUNTER — Ambulatory Visit: Payer: Self-pay | Admitting: Internal Medicine

## 2013-09-26 LAB — HEPATIC FUNCTION PANEL A (ARMC)
ALBUMIN: 3.7 g/dL (ref 3.4–5.0)
ALT: 32 U/L (ref 12–78)
AST: 22 U/L (ref 15–37)
Alkaline Phosphatase: 99 U/L
BILIRUBIN DIRECT: 0.1 mg/dL (ref 0.00–0.20)
Bilirubin,Total: 0.4 mg/dL (ref 0.2–1.0)
Total Protein: 7.2 g/dL (ref 6.4–8.2)

## 2013-09-26 LAB — CBC CANCER CENTER
BASOS ABS: 0.1 x10 3/mm (ref 0.0–0.1)
BASOS PCT: 0.9 %
Eosinophil #: 0.1 x10 3/mm (ref 0.0–0.7)
Eosinophil %: 2 %
HCT: 39.9 % (ref 35.0–47.0)
HGB: 13.1 g/dL (ref 12.0–16.0)
LYMPHS PCT: 32.4 %
Lymphocyte #: 1.9 x10 3/mm (ref 1.0–3.6)
MCH: 29.3 pg (ref 26.0–34.0)
MCHC: 32.9 g/dL (ref 32.0–36.0)
MCV: 89 fL (ref 80–100)
MONOS PCT: 8.4 %
Monocyte #: 0.5 x10 3/mm (ref 0.2–0.9)
NEUTROS ABS: 3.3 x10 3/mm (ref 1.4–6.5)
NEUTROS PCT: 56.3 %
PLATELETS: 232 x10 3/mm (ref 150–440)
RBC: 4.49 10*6/uL (ref 3.80–5.20)
RDW: 13.8 % (ref 11.5–14.5)
WBC: 5.9 x10 3/mm (ref 3.6–11.0)

## 2013-09-26 LAB — CREATININE, SERUM
Creatinine: 0.96 mg/dL (ref 0.60–1.30)
EGFR (African American): >60
EGFR (Non-African Amer.): 59 — ABNORMAL LOW

## 2013-09-27 LAB — CANCER ANTIGEN 27.29: CA 27.29: 28.3 U/mL (ref 0.0–38.6)

## 2013-10-05 ENCOUNTER — Ambulatory Visit: Payer: Self-pay | Admitting: Internal Medicine

## 2013-10-05 ENCOUNTER — Ambulatory Visit: Payer: Self-pay

## 2013-12-04 ENCOUNTER — Ambulatory Visit: Payer: Self-pay | Admitting: Surgery

## 2013-12-08 ENCOUNTER — Ambulatory Visit: Payer: Self-pay | Admitting: Surgery

## 2013-12-11 LAB — PATHOLOGY REPORT

## 2013-12-18 ENCOUNTER — Ambulatory Visit: Payer: Self-pay | Admitting: Surgery

## 2013-12-18 LAB — HEPATIC FUNCTION PANEL A (ARMC)
ALT: 32 U/L (ref 12–78)
Albumin: 3.7 g/dL (ref 3.4–5.0)
Alkaline Phosphatase: 104 U/L
Bilirubin,Total: 0.4 mg/dL (ref 0.2–1.0)
SGOT(AST): 19 U/L (ref 15–37)
TOTAL PROTEIN: 7.3 g/dL (ref 6.4–8.2)

## 2013-12-18 LAB — BASIC METABOLIC PANEL
Anion Gap: 2 — ABNORMAL LOW (ref 7–16)
BUN: 22 mg/dL — AB (ref 7–18)
CALCIUM: 9.8 mg/dL (ref 8.5–10.1)
CHLORIDE: 100 mmol/L (ref 98–107)
CO2: 34 mmol/L — AB (ref 21–32)
CREATININE: 0.97 mg/dL (ref 0.60–1.30)
EGFR (African American): >60
EGFR (Non-African Amer.): 58 — ABNORMAL LOW
Glucose: 75 mg/dL (ref 65–99)
Osmolality: 274 (ref 275–301)
POTASSIUM: 4.7 mmol/L (ref 3.5–5.1)
Sodium: 136 mmol/L (ref 136–145)

## 2013-12-18 LAB — CBC
HCT: 41 % (ref 35.0–47.0)
HGB: 13.6 g/dL (ref 12.0–16.0)
MCH: 29.8 pg (ref 26.0–34.0)
MCHC: 33.1 g/dL (ref 32.0–36.0)
MCV: 90 fL (ref 80–100)
PLATELETS: 239 10*3/uL (ref 150–440)
RBC: 4.56 10*6/uL (ref 3.80–5.20)
RDW: 13.4 % (ref 11.5–14.5)
WBC: 6.4 10*3/uL (ref 3.6–11.0)

## 2013-12-25 ENCOUNTER — Ambulatory Visit: Payer: Self-pay | Admitting: Surgery

## 2014-01-01 LAB — PATHOLOGY REPORT

## 2014-01-02 ENCOUNTER — Ambulatory Visit: Payer: Self-pay | Admitting: Internal Medicine

## 2014-01-02 LAB — HEPATIC FUNCTION PANEL A (ARMC)
AST: 27 U/L (ref 15–37)
Albumin: 3.5 g/dL (ref 3.4–5.0)
Alkaline Phosphatase: 113 U/L
BILIRUBIN TOTAL: 0.3 mg/dL (ref 0.2–1.0)
Bilirubin, Direct: 0.1 mg/dL (ref 0.00–0.20)
SGPT (ALT): 50 U/L (ref 12–78)
Total Protein: 7.2 g/dL (ref 6.4–8.2)

## 2014-01-02 LAB — CREATININE, SERUM
Creatinine: 1 mg/dL (ref 0.60–1.30)
EGFR (African American): >60
GFR CALC NON AF AMER: 55 — AB

## 2014-01-02 LAB — CBC CANCER CENTER
Basophil #: 0.1 x10 3/mm (ref 0.0–0.1)
Basophil %: 0.8 %
Eosinophil #: 0.2 x10 3/mm (ref 0.0–0.7)
Eosinophil %: 2.2 %
HCT: 38 % (ref 35.0–47.0)
HGB: 12.9 g/dL (ref 12.0–16.0)
Lymphocyte #: 2.1 x10 3/mm (ref 1.0–3.6)
Lymphocyte %: 28.7 %
MCH: 30.1 pg (ref 26.0–34.0)
MCHC: 33.9 g/dL (ref 32.0–36.0)
MCV: 89 fL (ref 80–100)
Monocyte #: 0.5 x10 3/mm (ref 0.2–0.9)
Monocyte %: 6.9 %
Neutrophil #: 4.6 x10 3/mm (ref 1.4–6.5)
Neutrophil %: 61.4 %
Platelet: 266 x10 3/mm (ref 150–440)
RBC: 4.29 10*6/uL (ref 3.80–5.20)
RDW: 13.4 % (ref 11.5–14.5)
WBC: 7.5 x10 3/mm (ref 3.6–11.0)

## 2014-01-05 LAB — CANCER ANTIGEN 27.29: CA 27.29: 23.9 U/mL (ref 0.0–38.6)

## 2014-02-02 ENCOUNTER — Ambulatory Visit: Payer: Self-pay | Admitting: Internal Medicine

## 2014-02-25 LAB — CBC CANCER CENTER
BASOS PCT: 1.2 %
Basophil #: 0.1 x10 3/mm (ref 0.0–0.1)
Eosinophil #: 0.2 x10 3/mm (ref 0.0–0.7)
Eosinophil %: 3.2 %
HCT: 39.8 % (ref 35.0–47.0)
HGB: 13.7 g/dL (ref 12.0–16.0)
Lymphocyte #: 1.7 x10 3/mm (ref 1.0–3.6)
Lymphocyte %: 35.2 %
MCH: 30.3 pg (ref 26.0–34.0)
MCHC: 34.3 g/dL (ref 32.0–36.0)
MCV: 88 fL (ref 80–100)
MONOS PCT: 8.6 %
Monocyte #: 0.4 x10 3/mm (ref 0.2–0.9)
NEUTROS ABS: 2.5 x10 3/mm (ref 1.4–6.5)
NEUTROS PCT: 51.8 %
Platelet: 235 x10 3/mm (ref 150–440)
RBC: 4.51 10*6/uL (ref 3.80–5.20)
RDW: 13.4 % (ref 11.5–14.5)
WBC: 4.9 x10 3/mm (ref 3.6–11.0)

## 2014-03-04 ENCOUNTER — Ambulatory Visit: Payer: Self-pay | Admitting: Internal Medicine

## 2014-03-04 LAB — CBC CANCER CENTER
BASOS ABS: 0.1 x10 3/mm (ref 0.0–0.1)
BASOS PCT: 1.3 %
EOS ABS: 0.2 x10 3/mm (ref 0.0–0.7)
Eosinophil %: 3.5 %
HCT: 39 % (ref 35.0–47.0)
HGB: 13.2 g/dL (ref 12.0–16.0)
Lymphocyte #: 1.6 x10 3/mm (ref 1.0–3.6)
Lymphocyte %: 32.5 %
MCH: 30.5 pg (ref 26.0–34.0)
MCHC: 33.9 g/dL (ref 32.0–36.0)
MCV: 90 fL (ref 80–100)
MONOS PCT: 8.6 %
Monocyte #: 0.4 x10 3/mm (ref 0.2–0.9)
NEUTROS ABS: 2.7 x10 3/mm (ref 1.4–6.5)
Neutrophil %: 54.1 %
Platelet: 233 x10 3/mm (ref 150–440)
RBC: 4.33 10*6/uL (ref 3.80–5.20)
RDW: 13.4 % (ref 11.5–14.5)
WBC: 4.9 x10 3/mm (ref 3.6–11.0)

## 2014-03-11 LAB — CBC CANCER CENTER
BASOS ABS: 0.1 x10 3/mm (ref 0.0–0.1)
Basophil %: 1.1 %
EOS ABS: 0.2 x10 3/mm (ref 0.0–0.7)
EOS PCT: 2.9 %
HCT: 38.3 % (ref 35.0–47.0)
HGB: 13.1 g/dL (ref 12.0–16.0)
Lymphocyte #: 1.7 x10 3/mm (ref 1.0–3.6)
Lymphocyte %: 31.3 %
MCH: 30.6 pg (ref 26.0–34.0)
MCHC: 34.1 g/dL (ref 32.0–36.0)
MCV: 90 fL (ref 80–100)
MONO ABS: 0.5 x10 3/mm (ref 0.2–0.9)
Monocyte %: 9.4 %
Neutrophil #: 2.9 x10 3/mm (ref 1.4–6.5)
Neutrophil %: 55.3 %
Platelet: 214 x10 3/mm (ref 150–440)
RBC: 4.27 10*6/uL (ref 3.80–5.20)
RDW: 13.4 % (ref 11.5–14.5)
WBC: 5.3 x10 3/mm (ref 3.6–11.0)

## 2014-03-18 LAB — CBC CANCER CENTER
Basophil #: 0 x10 3/mm (ref 0.0–0.1)
Basophil %: 1 %
Eosinophil #: 0.2 x10 3/mm (ref 0.0–0.7)
Eosinophil %: 3.8 %
HCT: 38.4 % (ref 35.0–47.0)
HGB: 13.2 g/dL (ref 12.0–16.0)
Lymphocyte #: 1.4 x10 3/mm (ref 1.0–3.6)
Lymphocyte %: 30.4 %
MCH: 30.5 pg (ref 26.0–34.0)
MCHC: 34.3 g/dL (ref 32.0–36.0)
MCV: 89 fL (ref 80–100)
Monocyte #: 0.4 x10 3/mm (ref 0.2–0.9)
Monocyte %: 9.7 %
NEUTROS PCT: 55.1 %
Neutrophil #: 2.5 x10 3/mm (ref 1.4–6.5)
Platelet: 208 x10 3/mm (ref 150–440)
RBC: 4.31 10*6/uL (ref 3.80–5.20)
RDW: 13.1 % (ref 11.5–14.5)
WBC: 4.5 x10 3/mm (ref 3.6–11.0)

## 2014-03-25 LAB — CBC CANCER CENTER
BASOS PCT: 1 %
Basophil #: 0 x10 3/mm (ref 0.0–0.1)
EOS ABS: 0.2 x10 3/mm (ref 0.0–0.7)
EOS PCT: 3.3 %
HCT: 40.6 % (ref 35.0–47.0)
HGB: 13.4 g/dL (ref 12.0–16.0)
LYMPHS ABS: 1.5 x10 3/mm (ref 1.0–3.6)
Lymphocyte %: 32.6 %
MCH: 29.8 pg (ref 26.0–34.0)
MCHC: 33 g/dL (ref 32.0–36.0)
MCV: 90 fL (ref 80–100)
Monocyte #: 0.4 x10 3/mm (ref 0.2–0.9)
Monocyte %: 8.5 %
Neutrophil #: 2.5 x10 3/mm (ref 1.4–6.5)
Neutrophil %: 54.6 %
PLATELETS: 227 x10 3/mm (ref 150–440)
RBC: 4.5 10*6/uL (ref 3.80–5.20)
RDW: 13.3 % (ref 11.5–14.5)
WBC: 4.5 x10 3/mm (ref 3.6–11.0)

## 2014-04-01 LAB — CBC CANCER CENTER
BASOS PCT: 0.8 %
Basophil #: 0 x10 3/mm (ref 0.0–0.1)
Eosinophil #: 0.1 x10 3/mm (ref 0.0–0.7)
Eosinophil %: 2.9 %
HCT: 41 % (ref 35.0–47.0)
HGB: 13.7 g/dL (ref 12.0–16.0)
LYMPHS ABS: 1.3 x10 3/mm (ref 1.0–3.6)
Lymphocyte %: 26.7 %
MCH: 30.1 pg (ref 26.0–34.0)
MCHC: 33.4 g/dL (ref 32.0–36.0)
MCV: 90 fL (ref 80–100)
MONO ABS: 0.4 x10 3/mm (ref 0.2–0.9)
Monocyte %: 9.1 %
Neutrophil #: 2.9 x10 3/mm (ref 1.4–6.5)
Neutrophil %: 60.5 %
Platelet: 251 x10 3/mm (ref 150–440)
RBC: 4.55 10*6/uL (ref 3.80–5.20)
RDW: 13.3 % (ref 11.5–14.5)
WBC: 4.9 x10 3/mm (ref 3.6–11.0)

## 2014-04-04 ENCOUNTER — Ambulatory Visit: Payer: Self-pay | Admitting: Oncology

## 2014-04-04 ENCOUNTER — Ambulatory Visit: Payer: Self-pay | Admitting: Internal Medicine

## 2014-05-05 ENCOUNTER — Ambulatory Visit: Payer: Self-pay | Admitting: Internal Medicine

## 2014-06-26 ENCOUNTER — Ambulatory Visit: Payer: Self-pay | Admitting: Internal Medicine

## 2014-07-07 ENCOUNTER — Ambulatory Visit: Payer: Self-pay | Admitting: Internal Medicine

## 2014-07-07 LAB — CBC CANCER CENTER
Basophil #: 0 x10 3/mm (ref 0.0–0.1)
Basophil %: 0.9 %
EOS PCT: 2.2 %
Eosinophil #: 0.1 x10 3/mm (ref 0.0–0.7)
HCT: 40.6 % (ref 35.0–47.0)
HGB: 13.5 g/dL (ref 12.0–16.0)
LYMPHS ABS: 1.6 x10 3/mm (ref 1.0–3.6)
Lymphocyte %: 34.9 %
MCH: 30.1 pg (ref 26.0–34.0)
MCHC: 33.1 g/dL (ref 32.0–36.0)
MCV: 91 fL (ref 80–100)
MONOS PCT: 9 %
Monocyte #: 0.4 x10 3/mm (ref 0.2–0.9)
NEUTROS ABS: 2.5 x10 3/mm (ref 1.4–6.5)
NEUTROS PCT: 53 %
Platelet: 240 x10 3/mm (ref 150–440)
RBC: 4.47 10*6/uL (ref 3.80–5.20)
RDW: 13.4 % (ref 11.5–14.5)
WBC: 4.7 x10 3/mm (ref 3.6–11.0)

## 2014-07-07 LAB — HEPATIC FUNCTION PANEL A (ARMC)
ALBUMIN: 3.7 g/dL (ref 3.4–5.0)
ALK PHOS: 76 U/L
BILIRUBIN DIRECT: 0.1 mg/dL (ref 0.0–0.2)
Bilirubin,Total: 0.5 mg/dL (ref 0.2–1.0)
SGOT(AST): 21 U/L (ref 15–37)
SGPT (ALT): 34 U/L
Total Protein: 7 g/dL (ref 6.4–8.2)

## 2014-07-07 LAB — CREATININE, SERUM
Creatinine: 0.92 mg/dL (ref 0.60–1.30)
EGFR (African American): >60
EGFR (Non-African Amer.): >60

## 2014-07-08 LAB — CANCER ANTIGEN 27.29: CA 27.29: 28.6 U/mL (ref 0.0–38.6)

## 2014-08-04 ENCOUNTER — Ambulatory Visit: Payer: Self-pay | Admitting: Internal Medicine

## 2014-08-13 ENCOUNTER — Observation Stay: Payer: Self-pay | Admitting: Internal Medicine

## 2014-08-13 LAB — CBC WITH DIFFERENTIAL/PLATELET
BASOS PCT: 0.2 %
Basophil #: 0 10*3/uL (ref 0.0–0.1)
Eosinophil #: 0 10*3/uL (ref 0.0–0.7)
Eosinophil %: 0 %
HCT: 39.2 % (ref 35.0–47.0)
HGB: 13.4 g/dL (ref 12.0–16.0)
LYMPHS ABS: 0.6 10*3/uL — AB (ref 1.0–3.6)
LYMPHS PCT: 6 %
MCH: 30.5 pg (ref 26.0–34.0)
MCHC: 34.2 g/dL (ref 32.0–36.0)
MCV: 89 fL (ref 80–100)
MONOS PCT: 5.6 %
Monocyte #: 0.6 x10 3/mm (ref 0.2–0.9)
Neutrophil #: 9.1 10*3/uL — ABNORMAL HIGH (ref 1.4–6.5)
Neutrophil %: 88.2 %
PLATELETS: 194 10*3/uL (ref 150–440)
RBC: 4.4 10*6/uL (ref 3.80–5.20)
RDW: 13.1 % (ref 11.5–14.5)
WBC: 10.3 10*3/uL (ref 3.6–11.0)

## 2014-08-13 LAB — HEPATIC FUNCTION PANEL A (ARMC)
ALK PHOS: 70 U/L
ALT: 25 U/L
AST: 25 U/L (ref 15–37)
Albumin: 3 g/dL — ABNORMAL LOW (ref 3.4–5.0)
BILIRUBIN DIRECT: 0.2 mg/dL (ref 0.0–0.2)
BILIRUBIN TOTAL: 0.8 mg/dL (ref 0.2–1.0)
TOTAL PROTEIN: 6.9 g/dL (ref 6.4–8.2)

## 2014-08-13 LAB — URINALYSIS, COMPLETE
Bacteria: NONE SEEN
Bilirubin,UR: NEGATIVE
Glucose,UR: NEGATIVE mg/dL (ref 0–75)
Ketone: NEGATIVE
Nitrite: NEGATIVE
Ph: 6 (ref 4.5–8.0)
Protein: NEGATIVE
Specific Gravity: 1.006 (ref 1.003–1.030)
Squamous Epithelial: <1

## 2014-08-13 LAB — CLOSTRIDIUM DIFFICILE(ARMC)

## 2014-08-13 LAB — TROPONIN I: TROPONIN-I: 0.02 ng/mL

## 2014-08-13 LAB — COMPREHENSIVE METABOLIC PANEL
ANION GAP: 7 (ref 7–16)
Albumin: 3.1 g/dL — ABNORMAL LOW (ref 3.4–5.0)
Alkaline Phosphatase: 70 U/L
BUN: 14 mg/dL (ref 7–18)
Bilirubin,Total: 0.8 mg/dL (ref 0.2–1.0)
CHLORIDE: 98 mmol/L (ref 98–107)
CREATININE: 1.17 mg/dL (ref 0.60–1.30)
Calcium, Total: 8.9 mg/dL (ref 8.5–10.1)
Co2: 28 mmol/L (ref 21–32)
EGFR (Non-African Amer.): 48 — ABNORMAL LOW
GFR CALC AF AMER: 58 — AB
Glucose: 123 mg/dL — ABNORMAL HIGH (ref 65–99)
OSMOLALITY: 268 (ref 275–301)
Potassium: 3.1 mmol/L — ABNORMAL LOW (ref 3.5–5.1)
SGOT(AST): 26 U/L (ref 15–37)
SGPT (ALT): 28 U/L
Sodium: 133 mmol/L — ABNORMAL LOW (ref 136–145)
Total Protein: 6.9 g/dL (ref 6.4–8.2)

## 2014-08-13 LAB — LIPASE, BLOOD: Lipase: 61 U/L — ABNORMAL LOW (ref 73–393)

## 2014-08-14 LAB — CBC WITH DIFFERENTIAL/PLATELET
BASOS PCT: 0.3 %
Basophil #: 0 10*3/uL (ref 0.0–0.1)
Eosinophil #: 0 10*3/uL (ref 0.0–0.7)
Eosinophil %: 0.6 %
HCT: 35 % (ref 35.0–47.0)
HGB: 11.8 g/dL — ABNORMAL LOW (ref 12.0–16.0)
LYMPHS ABS: 0.9 10*3/uL — AB (ref 1.0–3.6)
LYMPHS PCT: 15.8 %
MCH: 30.6 pg (ref 26.0–34.0)
MCHC: 33.6 g/dL (ref 32.0–36.0)
MCV: 91 fL (ref 80–100)
Monocyte #: 0.5 x10 3/mm (ref 0.2–0.9)
Monocyte %: 8.3 %
NEUTROS ABS: 4.2 10*3/uL (ref 1.4–6.5)
NEUTROS PCT: 75 %
Platelet: 162 10*3/uL (ref 150–440)
RBC: 3.84 10*6/uL (ref 3.80–5.20)
RDW: 13.5 % (ref 11.5–14.5)
WBC: 5.7 10*3/uL (ref 3.6–11.0)

## 2014-08-14 LAB — BASIC METABOLIC PANEL
ANION GAP: 4 — AB (ref 7–16)
BUN: 12 mg/dL (ref 7–18)
CO2: 29 mmol/L (ref 21–32)
Calcium, Total: 8.9 mg/dL (ref 8.5–10.1)
Chloride: 108 mmol/L — ABNORMAL HIGH (ref 98–107)
Creatinine: 0.85 mg/dL (ref 0.60–1.30)
EGFR (Non-African Amer.): >60
Glucose: 88 mg/dL (ref 65–99)
OSMOLALITY: 280 (ref 275–301)
POTASSIUM: 3.2 mmol/L — AB (ref 3.5–5.1)
SODIUM: 141 mmol/L (ref 136–145)

## 2014-08-15 LAB — BASIC METABOLIC PANEL
Anion Gap: 5 — ABNORMAL LOW (ref 7–16)
BUN: 6 mg/dL — ABNORMAL LOW (ref 7–18)
CALCIUM: 8.4 mg/dL — AB (ref 8.5–10.1)
Chloride: 110 mmol/L — ABNORMAL HIGH (ref 98–107)
Co2: 27 mmol/L (ref 21–32)
Creatinine: 0.74 mg/dL (ref 0.60–1.30)
EGFR (Non-African Amer.): >60
Glucose: 86 mg/dL (ref 65–99)
OSMOLALITY: 280 (ref 275–301)
Potassium: 3.1 mmol/L — ABNORMAL LOW (ref 3.5–5.1)
SODIUM: 142 mmol/L (ref 136–145)

## 2014-08-15 LAB — STOOL CULTURE

## 2014-08-16 LAB — BASIC METABOLIC PANEL
Anion Gap: 6 — ABNORMAL LOW (ref 7–16)
BUN: 8 mg/dL (ref 7–18)
CALCIUM: 8.6 mg/dL (ref 8.5–10.1)
CREATININE: 0.77 mg/dL (ref 0.60–1.30)
Chloride: 109 mmol/L — ABNORMAL HIGH (ref 98–107)
Co2: 28 mmol/L (ref 21–32)
EGFR (African American): >60
EGFR (Non-African Amer.): >60
GLUCOSE: 83 mg/dL (ref 65–99)
Osmolality: 282 (ref 275–301)
Potassium: 3.6 mmol/L (ref 3.5–5.1)
Sodium: 143 mmol/L (ref 136–145)

## 2014-12-22 NOTE — Consult Note (Signed)
[facility] Department of Radiation Oncology  MRN: [id number] Pt Name: [name]   Visit ID:  [id number]    DOB: [birth year] Gender: Female   ____________________________________________________________________________  1     This document electronically signed by:  Armstead Peaks, MD, 08/12/2012 3:40:31 PM Radiation Oncology Initial Consult Date of Service:  08/12/2012  Referred by:  Loreli Dollar, MD  Diagnosis:   Primary 174.5 - Malignant neoplasm of lower-outer quadrant of female breast, Diagnosed 08/12/2012 (Active)  ,  HPI: 74 year old female with 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 Now for adjuvant radiation   patient is a 74 year old female who presented with an abnormal mammogram of her right breast. Initial core biopsy was positive for well-differentiated invasive mammary carcinoma. she underwent a wide local excision and sentinel node biopsy. Tumor 1.2 cm grade 1 with margins negative but close at 2 mm. One of 2 sentinel lymph nodes had a metastatic focus measuring 4 mm with no extranodal extension noted. Tumor was ER/PR positive strongly HER-2/neu not overexpressed. she underwent adjuvant chemowith 4 cycles of Cytoxan Adriamycin followed by weekly Taxol. 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 She also is quite upset as I a previous a treated her husband5 years prior who succumbed to stage IV lung cancer. She seen today for evaluation regarding adjuvant radiation therapy. She is doing well at this time. She has not noticed any evidence of lymphedema of her upper extremity. She do not have full axillymph node dissection  Pathology Report: pathology report reviewed    Imaging Report:  mammograms ultrasound reviewed   Referral Report: Clinical Notes Reviewed.  Planned Treatment Regimen:adjuvant radiation therapy to right breast and peripheral lymphatics   Past History: Medical History Breast Cancer, and Hypertension.   Procedure/Surgical History right lumpectomy, right sentinal node biopsy and tonsillectomy.    Allergies:   Hydrocodone , Augmentin Clavulanate  and Sulfa.  Medications:   Aspirin 1 (81 mg) TABLET ORAL daily (08/12/2012)      Calcium D 1 (600/200 mg) TABLET ORAL daily (08/12/2012)      Centrum Performance  (08/12/2012)      Lisinopril  (08/12/2012)  once a day in the morning.    Lorazepam (0.5 mg) TABLET ORAL Take as Directed (08/12/2012)  three times a day as needed for anxiety.    Promethazine  (08/12/2012)  every six hours as needed for nausea or vomiting.    Tramadol  (08/12/2012)  every four hours as needed for pain.  Review of Systems:   according to the nurse's notesPatient denies any loss, fatigue, weakness, fever, chills or night sweats. Patient denies any loss of vision, blurred vision. Patient denies any ringing  of the ears or hearing loss. No irregular heartbeat. Patient denies heart murmur or history of fainting. Patient dany chest pain or pain radiating to her upper extremities. Patient denies any shortness of breath, difficulty breathing at night, cough or hemoptysis. Patient denies any swelling in the lower legs. Patient denies any nausea vomiting, vomiting of blooor coffee ground material in the vomitus. Patient denies any stomach pain. Patient states has had normal bowel movements no significant constipation or diarrhea. Patient denies any dysuria, hematuria or significant nocturia. Patient denies any problems swelling in the  joints or loss of balance. Patient denies any skin changes, loss of hair or loss of weight. Patient denies any excessive worrying or anxiety or significant depression. Patient denies any problems with  insomnia. Patient denies excesthirst, polyuria, polydipsia. Patient denies any swollen glands, patient denies easy bruising or easy bleeding. Patient denies any recent infections, allergies or URI. Patient "s visual fields have not changed significantly in recent time.  Nursing Vital Signs: Performed on 08/12/2012 8:49 AM  Weight - 80.3 kg  Temperature - 97.8 F  Pulse - 83 /min  Respiration - 20 /min  Systolic - 127 mm(hg)  Diastolic - 81 mm(hg)  Pain - 0  Fatigue - 1  BP - 135/ 81  Physical Exam:well-developed well-nourished female with alopecia of the scalp in NAD.  Constitutional No evidence of impaired alertness, inadequate appearance, premature or advanced chronologic age, uncooperativeness, developmental delays, altered mood and affect and disorientation.  Head No evidence of alopecia, abnormal cephalic and scars.  Eyes No evidence of conjunctivitis, keratitis, hardening of the lens, nonreactive pupil(s), retinal abnormalities and scleral abnormalities.  ENMT No evidence of ear abnormalities, oral abnormalities, nasal obstructioropharynx obstruction, sinusitis, throat abnormalities and tongue abnormalities.  Neck No evidence of tender or enlarged lymph nodes, neck abnormalities, restricted range of motion and enlarged thyroid gland.  Integumentary No evidence of blistering, dry skin, erythema, nails changes, altered pigmentation, rash and urticaria.  Breasts right breast is a wide local excision site which is well-healed. No dominant mass or nodularity is noted in either breast into position examined. No axillary or supraclavicular adenopathy is identified.  Chest No evidence of chest abnormalities and tender or enlarged lymph nodes.  Cardiovascular No evidence of arterial pulse(s) abnormalities, abnormal heart rate, heart arrhythmia and abnormal heart sounds.  Respiratory No evidence of abnormal breath sounds, chest abnormalities on palpation and chest abnormalities on percussion.   Gastrointestinal No evidence of anal and/or perineal abnormalities and rectal abnormalities.  Abdomen No evidence of abdominal abnormalitabnormal bowel sounds, hepatomegaly and splenomegaly.  Genitourinary No evidence of bladder dysfunction and kidney dysfunction.  Extremities No evidence of lower extremities abnormalities, tender or enlarged lymph nodes and upper extremities abnormalities.  Back/Spine No evidence of reduced flexibility and abnormal spinal curvature.  Musculoskeletal No evidence of bone abnormalities, joint abnormalities, compromised muscle tone and restricted range of motion.  Neurologic No evidence of impaired cranialnerve(s), uncoordinated gait, motor impairment, a sensory deficit and impaired reflexes.  Hematologic/Lymphatic No evidence of tender or enlarged axillae lymph nodes, tender or enlarged lymph nodes, tender or enlarged groin lymph nodes, tender or enlarged neck lymph nodes and petechiae / purpura / ecchymosis.    Impression/Plan:   stage IIa invasive mammary carcinoma the right breast as was wide local excision and sentinel node biopsy and adjuvant chemotherapy in 74 year old female  at this time I recommengoing ahead with adjuvant radiation therapy to her right breast. Because she had a limited lymph node dissection on having 2 sentinel lymph nodes removed believe any to incorporate her peripheral lymphatics of her radiation field. Would plan on delive5000 cGy to both her right breast and peripheral lymphatics. He also boost or scar another 1400 cGy using electron beam treatment. Risks and benefits of treatment including skin reaction, fatigue, alteration of the fibrosis of the right breast, and poalteration of blood counts were all discussed in detail with the patient. she seems to comprehend my treatment plan well. I have set her up for CT simulation later this week. I've also applies run exercise instruction  for her right upper extremity ttry to prevent any  lymphedema. all questions were answered. she was scheduled for CT simulation later this week.  would like to take this opportunity to thank you for allowing me to participate in this patient's care.     cc:  Bethann Humble, MD

## 2014-12-26 NOTE — Op Note (Signed)
PATIENT NAME:  [name], [name] MR#:  [id number] DATE OF BIRTH:  02-Nov-1940  DATE OF PROCEDURE:  12/25/2013  PREOPERATIVE DIAGNOSIS: Carcinoma of the left breast.   POSTOPERATIVE DIAGNOSIS: Carcinoma of the left breast.   PROCEDURE: Left partial mastectomy with axillary sentinel lymph node biopsy.   SURGEON: Renda Rolls, M.D.   ANESTHESIA: General.   INDICATIONS: This 74 year old female with prior history of cancer of the right breast recently had a mammogram depicting a cluster of microcalcifications in the outer aspect of the left breast. 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 The patient had preoperative x-ray needle localization and the wire was found at approximately 4-o'clock position of the left breast. Also, she had preoperative injection of radioactive technetium sulfur colloid.   DESCRIPTION OF PROCEDURE: The patient was placed on the operating table in the supine position under general anesthesia. The left arm was placed on a lateral arm support. The dressing was removed from the lateral aspect of the left breast exposing the Kopans wire, which was cut 3 cm from the skin. Next, the breast, axilla, chest wall and upper arm were prepared with ChloraPrep and draped in a sterile manner.   A curvilinear incision was made from 3-o'clock to 5 o'clock position in the peripheral aspect of the left breast just anterior to the entrance point of the Kopans wire. It is noted that the mammograms were viewed prior to and during surgery. The dissection was carried down through subcutaneous tissues to encounter the wire. It is noted that a narrow ellipse of skin was removed with the underlying tissue and the 5-o'clock end of the skin ellipse was tagged with a stitch for the pathologist's orientation. The dissection was carried down to encounter the wire and dissected out a portion of tissue surrounding the wire and as this was removed it was  marked with the surgical markers using sutures 3-0 nylon sutures to mark the medial, lateral, cranial, caudal, and deep margins. This was submitted for specimen mammogram and pathology.   Next, attention was turned to the axilla where the gamma counter was used to demonstrate the site of radioactivity in the inferior aspect of the axilla somewhat anterior and deep to the pectoralis major muscle. An oblique incision was made initially some 4 cm in length, but later had to be lengthened to closer to 6 cm. It is noted that the dissection carried down through superficial fascia and retracted the pectoralis major muscle anteriorly and dissected down deeply within the axilla just below the axillary vein adjacent to the rib cage. There were multiple blood vessels in this area. The sentinel lymph node was dissected free from surrounding structures and was excised. The ex vivo count was greater than 800 counts per second. The background count was less than 40 counts per second. There was no remaining palpable mass within the axilla. Hemostasis was intact. There sentinel lymph node was submitted for routine pathology.   The pathologist did call back to indicate that the margins of resection and the partial mastectomy incision were satisfactory.   The wound was inspected. The deeper tissues surrounding cautery artifact in the subcutaneous tissues were infiltrated with 0.5% Sensorcaine with epinephrine. I also used this to inject the subcutaneous tissues of the axillary wound. The axillary wound was further inspected and subsequently returned to close the breast wound with interrupted 4-0 chromic subcutaneous sutures and then a running 4-0 Monocryl subcuticular suture. Next, the axillary wound was closed  with 4-0 chromic subcutaneous sutures and 4-0 Monocryl running subcuticular suture. Both wounds were then dressed with Dermabond. The patient tolerated surgery satisfactorily and was prepared for transfer to the recovery  room.   ____________________________ Shela CommonsJ. Renda RollsWilton Caylan Schifano, MD jws:aw D: 12/25/2013 15:21:35 ET T: 12/25/2013 15:41:05 ET JOB#: [id number]  cc: Adella HareJ. Monica Lauralei Clouse, MD, <Dictator> Adella HareWILTON J Paizlee Kinder MD ELECTRONICALLY SIGNED 12/26/2013 11:31

## 2014-12-26 NOTE — Discharge Summary (Signed)
PATIENT NAME:  [name], Love L MR#:  [id number] DATE OF BIRTH:  1941/04/10  DATE OF ADMISSION:  08/13/2014 DATE OF DISCHARGE:  08/17/2014  DISCHARGE DIAGNOSES:  1.  Acute gastroenteritis due to Campylobacter jejuni infection.  2.  Hypokalemia.  3.  History of breast cancer diagnosed 2012 status post lumpectomy, sentinel node biopsy, chemoradiation.  4.  Left breast cancer status post partial mastectomy and radiation, currently in remission. 5.  Hypertension.   HISTORY OF PRESENT ILLNESS: This 74 year old Caucasian female with past medical history of hypertension and breast cancer in remission presented to the hospital with 4 day history of diarrhea. She denied any recent travel. Denies eating any suspicious foods. Four days prior to admission she had subjective fevers and chills, weakness, and then began to have watery greenish diarrhea greater than 20 bowel movements a day. On the day of admission by the time of admission she had developed a terrible headache and was getting very weak. She was having diarrhea pretty much every hour. At presentation her blood pressure was 90s systolic. She was admitted for hypotension, volume depletion, and treatment of diarrhea.   HOSPITAL COURSE BY PROBLEM:  1.  Acute gastroenteritis with Campylobacter jejuni. Stool cultures taken at admission were positive for Campylobacter jejuni. Negative for salmonella, shigella or Clostridium difficile. Negative for Giardia or other parasitic infection. She was initially treated with ciprofloxacin and metronidazole. Once culture results returned she was switched to azithromycin and she is discharged on oral azithromycin to complete a 7 day course. The stop date is December 18. At the time of discharge she is tolerating p.o. Her stools are much less frequent and more formed. She will be following up with her primary care physician next week.  2.  Hypokalemia: This was due to acute GI losses and was replaced during hospitalization.  At the time of discharge her potassium is normal.  3.  Hypertension: The patient's antihypertensives were initially held due to hypotension, but at the time of discharge her blood pressure has returned to normal and she is to resume lisinopril 5 mg daily. She will follow up with her primary care physician for further evaluation of hypertension.  4.  Breast cancer: She will continue to follow up the cancer center. Continue on anastrozole.  DISCHARGE PHYSICAL EXAMINATION:  VITAL SIGNS: Temperature 98.2, pulse 65, respirations 16, blood pressure 151/84, oxygenation 96% on room air.  GENERAL: No acute distress.  CARDIOVASCULAR: Regular rate and rhythm. No murmurs, rubs, or gallops. No peripheral edema. Peripheral pulses are 2 +.  PULMONARY: Lungs clear to auscultation bilaterally with good air movement.  ABDOMEN: Soft, nontender, nondistended, bowel sounds increased, no guarding or rebound.   LABORATORY DATA: As mentioned above stool cultures from December 10 positive for Campylobacter jejuni antigen. Discharge laboratories, sodium 143, potassium 3.6, chloride 109, bicarbonate 28, BUN 8, creatinine 0.77, glucose 83. LFTs were normal. White blood cells 5.7, hemoglobin 11.8, platelets 162,000, MCV 91. UA negative for infection.   CONDITION ON DISCHARGE: Stable.   DISCHARGE MEDICATIONS:  1. Centrum Performance oral multivitamin 1 tablet daily.  2. Aspirin 81 mg 1 tablet daily.  3. Calcium 600 mg-200 international units vitamin D, 1 tablet twice a day.  4. Vitamin B12, 2000 mcg orally once a day.  5. Vitamin E 100 international units 1 capsule daily.  6. Anastrozole 1 mg 1 tablet daily.  7. Lisinopril 5 mg 1 tablet daily.  8. Azithromycin 500  mg 1 tablet once a day, last dose 08/21/2014.   DISPOSITION:  The patient is being discharged to home. Her cousin will be staying with her the next few days to help her get settled. She has no home health needs.   DISCHARGE INSTRUCTIONS:  1.  Diet: Regular,  no restrictions.  2.  Activity: As tolerated, no restrictions.  3.  Time frame for followup: Follow up with Dr. Sampson Goon within 1 week.    ADDITIONAL INSTRUCTIONS: Advanced diet as tolerated, go slow. Advance activity as tolerated, go slow. Check blood pressure in the morning, if less than 120/80 you can hold lisinopril for that day.   TIME SPENT ON DISCHARGE:  40 minutes.     ____________________________ Ena Dawley. Clent Ridges, MD cpw:bu D: 08/19/2014 12:14:57 ET T: 08/19/2014 20:48:05 ET JOB#: [id number]  cc: Ena Dawley. Clent Ridges, MD, <Dictator> Gale Journey MD ELECTRONICALLY SIGNED 08/26/2014 9:09

## 2014-12-26 NOTE — Consult Note (Signed)
Reason for Visit: This 74 year old Female patient presents to the clinic for initial evaluation of  breast cancer .   Referred by Dr. Katrinka Blazing.  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  Pathology Report pathology report reviewed   Imaging Report mammograms reviewed   Referral Report clinical notes reviewed   Planned Treatment Regimen adjuvant whole breast radiation   HPI   patient is a 74 year old female well-known to our department having received adjuvant radiation therapy one year prior for a T1 C. N1 a grade 1 stage II A. invasive mammary carcinoma right breast status post wide local excision and central node biopsy. She had adjuvant radiation therapy to her right breast and peripheral lymphatics. She did extremely well with that. Recently mammogram was performedshowing 8 lesion in the left breast at the 1:00 position with segmental pleomorphic calcifications over a distance of 1 cm but are new from prior study. Stereotactic core biopsy was recommended. At that time 8.7 mm lesion of microinvasive memory Carcinoma was seen. Tumor was ER/PR negative. 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 One sentinel lymph node was negative.she's had some problems with healing and some redness in the area of the scar she's had some fluid removed by Dr. Katrinka Blazing otherwise she is doing well. She has no complaints from the right breast. She does have a Port-A-Cath placed in the left anterior chest wall time asking Dr. Katrinka Blazing to remove that prior to radiation.  Past Hx:    Breast Cancer:    Hypertension:     Sentinal Node Biopsy: Apr 2013   Right breast mass excision:    Tonsillectomy:   Past, Family and Social History:  Past Medical History positive   Cardiovascular hypertension   Past Surgical History tonsillectomy and prior history of right-sided breast cancer.   Family History positive   Family History Comments no past history of cancer   Social History noncontributory   Additional Past Medical and Surgical History sseen by herself today   Allergies:   Hydrocodone: Rash  Amoxicillin-Clavulanate: N/V/Diarrhea  Sulfa drugs: Unknown  Home Meds:  Home Medications: Medication Instructions Status  anastrozole 1 mg oral tablet 1 tab(s) orally once a day Active  aspirin 81 mg oral delayed release tablet 1 tab(s) orally once a day Active  Centrum Performance oral tablet 1 tab(s) orally once a day Active  lisinopril 5 mg oral tablet 1 tab(s) orally once a day (in the morning) Active  Calcium 600+D 600 mg-200 intl units oral tablet 1 tab(s) orally 2 times a day Active  Vitamin B-12 2000 microgram(s) orally once a day Active  vitamin E 100 intl units oral capsule 1 cap(s) orally once a day Active  traMADol 50 mg oral tablet 1 tab(s) orally every 4 hours, As Needed - for Pain Active   Review of Systems:  General negative   Performance Status (ECOG) 0   Skin negative   Breast see HPI   Ophthalmologic negative   ENMT negative   Respiratory and Thorax negative   Cardiovascular negative   Gastrointestinal negative   Genitourinary negative   Musculoskeletal negative  Neurological negative   Psychiatric negative   Hematology/Lymphatics negative   Endocrine negative   Allergic/Immunologic negative   Review of Systems   review of systems obtained from nurses notes  Nursing Notes:  Nursing Vital Signs and Chemo Nursing Nursing Notes: *CC Vital Signs Flowsheet:   11-May-15 15:40  Temp Temperature 97.3  Pulse Pulse 94  Respirations Respirations 21  SBP SBP  116  DBP DBP 76  Current Weight (kg) (kg) 80.5   Physical Exam:  General/Skin/HEENT:  General normal   Skin normal   Eyes normal   ENMT normal   Head and Neck normal   Additional PE well-developed female in NAD. She status post wide local excision of the left breast and sentinel node biopsy which are both healing well some slight erythema around the scar site. No dominant mass or nodularity is noted in either breast in 2 positions examined. No axillary or supraclavicular adenopathy is appreciated. Lungs are clear to A&P cardiac examination shows regular rate and rhythm. She has a Port-A-Cath present in the left anterior chest wall.   Breasts/Resp/CV/GI/GU:  Respiratory and Thorax normal   Cardiovascular normal   Gastrointestinal normal   Genitourinary normal   MS/Neuro/Psych/Lymph:  Musculoskeletal normal   Neurological normal   Lymphatics normal   Other Results:  Radiology Results: LabUnknown:    02-Apr-15 09:48, Digital Diagnostic Mammogram Bilateral  PACS Image   [facility]:  Digital Diagnostic Mammogram Bilateral   REASON FOR EXAM:    hx brst ca  COMMENTS:       PROCEDURE: MAM - MAM DGTL DIAGNOSTIC MAMMO W/CAD  - Dec 04 2013  9:48AM     CLINICAL DATA:  Right lumpectomy in 2013 for annual mammogram    EXAM:  DIGITAL DIAGNOSTIC  BILATERAL MAMMOGRAM WITH CAD    COMPARISON:  11/28/2012, 11/27/2011, 09/06/2010    ACR Breast Density Category c: The breast tissue is heterogeneously  dense, which may obscure small masses.  FINDINGS:  There is postsurgical scar right upper outer quadrant, stable. There  are no significant findings on the right otherwise.    Posteriorly in the left 1 o'clock position, there are segmental  pleomorphic calcifications over distance of about 1.0 cm. These are  new from prior studies.    Mammographic images were processed with CAD.     IMPRESSION:  Suspicious calcifications left breast    RECOMMENDATION:  Stereotactic  core needle biopsy recommended. The patient's referring  physician has been alerted and the patient is speaking with the  nurse navigator from the cancer center.    I have discussed the findings and recommendations with the patient.  Results were also provided in writing at the conclusion of the  visit. If applicable, a reminder letter will be sent to the patient  regarding the next appointment.    BI-RADS CATEGORY  4: Suspicious abnormality - biopsy should be  considered.      Electronically Signed    By: Esperanza Heir M.D.    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 Plan:   at this time elect to go ahead with whole breast radiation to her left breast. I  will plan on delivering 5000 cGy over 5 weeks boosting her scar another 1600 cGy based on the close margin. Risks and benefits of treatment including skin reactions, fatigue, alteration in blood counts, and some occlusion of superficial long were explained in detail to the patient. I've asked that Dr. Katrinka BlazingSmith to remove her Port-A-Cath from her left chest wall at this time and he will perform at this week. Patient is also going on a medical mission the end of this month we'll simulate her and have her ready to start treatments when she returns.patient also can continue on anastrozole this time and she will be seeingDr Sherrlyn HockPandit in followup for further recommendations.  I would like to take this opportunity for allowing me to participate in the care of your patient..   CC Referral:  cc: Dr. Katrinka BlazingSmith, Dr. Clydie Braunavid Fitzgerald   Electronic Signatures: Rushie Chestnuthrystal, Gordy CouncilmanGlenn S (MD)  (Signed 11-May-15 16:06)  Authored: HPI,  Diagnosis, Past Hx, PFSH, Allergies, Home Meds, ROS, Nursing Notes, Physical Exam, Other Results, Relevent Results, Encounter Assessment and Plan, CC Referring Physician   Last Updated: 11-May-15 16:06 by Rebeca Alerthrystal, Jonnatan Hanners S (MD)

## 2014-12-27 NOTE — Op Note (Signed)
PATIENT NAME:  [name], [name] MR#:  [id number] DATE OF BIRTH:  09/18/1940  DATE OF PROCEDURE:  12/20/2011  PREOPERATIVE DIAGNOSIS: Right breast mass, abscess of the mons pubis.   POSTOPERATIVE DIAGNOSIS: Right breast mass, abscess of the mons pubis.   PROCEDURE: Excision of right breast mass and incision and drainage of abscess of mons pubis.   ANESTHESIA: General.   SURGEON: Renda RollsWilton Smith, MD   INDICATIONS: This 74 year old female recently had a mammogram depicting an irregular density at approximately the 8:30 position of the right breast. Also ultrasound demonstrated a density at the same site. She had ultrasound-guided core biopsy, of which the specimen was scant with scant cellularity. It had some atypical cells and raised suspicion for infiltrating ductal carcinoma and excision of the entire mass was recommended for further evaluation.   On the morning of surgery she reported the development of slightly painful nodule at the mons pubis and examination revealed approximately a 1.5-cm red mass consistent with abscess. Incision and drainage was recommended.   PROCEDURE:  The patient was placed on the operating table in the supine position under general anesthesia. The dressing was removed from the right breast revealing the Kopans wire which entered the breast at approximately the 11 o'clock position. The wire was cut 3 cm from the skin. The breast was prepared with ChloraPrep and after that the mons pubis was also prepared with ChloraPrep. Next, the breast was draped out in a sterile manner. Ultrasound was placed into a sterile bag and identified the site at approximately the 8:30 position of the right breast.   A curvilinear incision was made in the lateral aspect of the right breast which was oriented longitudinally and extended from approximately the 10 o'clock to the 7 o'clock position of the breast. This was carried down through subcutaneous tissues into the denser glandular tissue. The wire  was encountered and also I began to feel firmness which was at approximately the 8:30 position of the breast, and with palpation of the firmness the dissection was carried out around this, removing the firm tissue with some normal-appearing surrounding breast tissue and fatty tissue. The mass was also marked with margin map using 3-0 nylon sutures to identify the superficial margin which would also be called the skin margin. Also, the inferior, cranial, medial, lateral, and deep margins were identified. This was submitted for specimen mammogram and gross inspection and routine pathology. The wound was inspected. It is noted during the course of the procedure one bleeding point was suture ligated with 4-0 chromic. Numerous small bleeding points were cauterized. Hemostasis was subsequently intact. Next, a portion of the glandular tissue of the breast was reapproximated with interrupted 4-0 chromic. Subcutaneous tissues were closed as well, and then the skin was closed with running 5-0 Monocryl subcuticular suture. I also waited until we got a call back from pathology indicating that the margins appear clear and the wound was dressed with Dermabond and allowed to dry.   Attention was turned to the mons pubis and a lancing incision was made, draining about 0.5 mL of thick yellow pus and also expressing some waxy material. This bled just slightly and then hemostasis was intact. Dressing was applied with paper tape.   The patient tolerated surgery satisfactorily and was then prepared for transfer to the recovery room.  ____________________________ Shela CommonsJ. Renda RollsWilton Smith, MD jws:bjt D: 12/20/2011 12:53:55 ET T: 12/20/2011 17:56:14 ET JOB#: [id number]  cc: Adella HareJ. Wilton Smith, MD, <Dictator> Adella HareWILTON J SMITH MD ELECTRONICALLY SIGNED 12/21/2011 13:03

## 2014-12-27 NOTE — Op Note (Signed)
PATIENT NAME:  Monica CannerSUGGS, [name] MR#:  [id number] DATE OF BIRTH:  [birth year]  DATE OF PROCEDURE:  02/02/2012  PREOPERATIVE DIAGNOSIS: Breast cancer.   POSTOPERATIVE DIAGNOSIS: Breast cancer.   PROCEDURE PERFORMED: Insertion of central venous catheter with subcutaneous infusion port.   SURGEON: Adella HareJ. Wilton Kobi Aller, MD   ANESTHESIA: Local 1% Xylocaine with monitored anesthesia care.   INDICATIONS: This 74 year old female recently had surgery for right breast cancer, now needs central venous access for chemotherapy.   DESCRIPTION OF PROCEDURE: The patient was placed on the operating table in the supine position. A rolled sheet was placed behind the shoulder blades. Her head was placed on a doughnut ring so that the neck was extended. Head was turned 20 degrees to the right. The neck and left subclavian areas were prepared with ChloraPrep and draped in a sterile manner.   The skin beneath the clavicle was infiltrated with 1% Xylocaine. A transversely oriented 3 cm incision was made, carried down through subcutaneous tissues. A subcutaneous pouch was created anterior to the deep fascia large enough to admit the PFM port. The jugular vein was identified with ultrasound, also could identify the carotid artery and thyroid. The skin overlying the jugular vein was infiltrated with 1% Xylocaine. A transversely oriented 6 mm incision was made. The patient was placed in Trendelenburg position. Ultrasound was used to guide a needle into the jugular vein. Guidewire did not thread initially and had to attempt this several times before gaining access into the central circulation. The guidewire was advanced. An ultrasound image was saved for the paper chart. Next, fluoroscopy was used to see the guidewire in the vena cava. The dilator and introducer sheath were advanced over the guidewire. The dilator and guidewire were removed. The catheter was advanced through the sheath and the sheath peeled away. The tip was positioned in  the superior vena cava as demonstrated with fluoroscopy and a fluoroscopic image was saved for the paper chart. The catheter was tunneled down to the subclavian port, pressure held over the tunnel site. Next, the catheter was cut to fit and attached to the PFM port using the accompanying sleeve. It was accessed with [facility] LLC[facility] needle, aspirated a trace of blood, and flushed with 10 mL of saline. The port was placed into the subcutaneous pouch and sutured to the deep fascia with 4-0 silk. Hemostasis was intact. The pouch was closed with 5-0 Vicryl. Both skin incisions were closed with 5-0 Vicryl subcuticular suture and Dermabond.   The patient tolerated surgery satisfactorily and was then prepared for transfer to the recovery room.  ____________________________ Shela CommonsJ. Renda RollsWilton Krysteena Stalker, MD jws:drc D: 02/02/2012 08:33:19 ET T: 02/02/2012 09:18:04 ET JOB#: [id number]  cc: Adella HareJ. Wilton Veron Senner, MD, <Dictator> Adella HareWILTON J Khaleel Beckom MD ELECTRONICALLY SIGNED 02/02/2012 18:42

## 2014-12-27 NOTE — Op Note (Signed)
PATIENT NAME:  [name], [name] MR#:  [id number] DATE OF BIRTH:  1941-02-09  DATE OF PROCEDURE:  01/01/2012  PREOPERATIVE DIAGNOSIS: Right breast cancer.   POSTOPERATIVE DIAGNOSIS: Right breast cancer.   PROCEDURE: Right axillary lymph node biopsy.   SURGEON: Renda RollsWilton Smith, M.D.   ANESTHESIA: General.   INDICATIONS: This 74 year old female recently had excision of a right breast mass with findings of cancer. Sentinel lymph node biopsy was recommended for further study.   PROCEDURE: The patient did have preoperative injection of radioactive technetium sulfur colloid. She was placed on the operating table in the supine position with the right arm on an arm support. The right breast, axilla, and upper arm were prepared with ChloraPrep and draped in a sterile manner.   The axilla was probed with the gamma counter demonstrating location of radioactivity in the inferior aspect of the right axilla. Next, an oblique incision was made in the inferior aspect of the axilla approximately 5 cm in length, carried down through subcutaneous tissues through superficial fascia and dissected down into the axilla fat pad. The gamma counter was used and demonstrated the presence of radioactivity in a lymph node adjacent to the ribcage. This lymph node was slightly firm and was dissected free from surrounding structures with a small amount of surrounding fatty tissue. There was also another small lymph node within the specimen. This was submitted for frozen section.  The axilla was further examined and there was no remaining palpable mass or visible adenopathy. It is noted that during the course of the procedure one clamped vessel was suture ligated with 4-0 chromic, and at the end of the procedure hemostasis was intact. The skin was closed with a running 5-0 Monocryl subcuticular suture.   The pathologist did report the presence of cancer in the sentinel lymph node.   The patient's wound was treated with Dermabond and  she was prepared for transfer to the recovery room.   ____________________________ Shela CommonsJ. Renda RollsWilton Smith, MD jws:bjt D: 01/01/2012 12:50:34 ET T: 01/01/2012 13:59:57 ET JOB#: [id number]  cc: Adella HareJ. Wilton Smith, MD, <Dictator> Adella HareWILTON J SMITH MD ELECTRONICALLY SIGNED 01/02/2012 20:57

## 2014-12-30 NOTE — H&P (Signed)
PATIENT NAME:  [name], [name] MR#:  [id number] DATE OF BIRTH:  29-Dec-1940  DATE OF ADMISSION:  08/13/2014  ADMITTING PHYSICIAN: Gladstone Lighter, MD  PRIMARY CARE PHYSICIAN: Adrian Prows, MD  CHIEF COMPLAINT: Diarrhea.   HISTORY OF PRESENT ILLNESS: [name] is a 74 year old Caucasian female with past medical history significant for hypertension and breast cancer, currently in remission, who is pretty active and otherwise healthy at baseline. Presents to the hospital secondary to four-day history of diarrhea. The patient states she has not traveled outside the states but she has been to [place] recently and [place]. Denies eating significant outside food. All of a sudden Monday she started feeling cold, having some chills all day, weak, and then later in the day she started having watery, greenish diarrhea with lots of mucus, but no blood in it. Since then she waited for a couple of days to see if this was only viral gastroenteritis, able to pass on, but for 3 days she had decreased appetite, poor p.o. intake. She had very bad headache all day today, getting much weaker. Diarrhea is every hour almost, just watery stool, and presented here to the Emergency Room. Blood pressure on the lower side with 04V systolic. No orthostatic hypotension noted. She had 1 episode of vomiting today and she also has a little low potassium. She is being admitted for observation for her worsening diarrhea. She denies any abdominal pain per se but whenever she needs to have a bowel movement she gets cramping lower abdominal pain.   PAST MEDICAL HISTORY: 1.  Right breast cancer 3 years ago, status post lumpectomy and sentinel node biopsy, status post chemoradiation at the time, now left breast cancer, status post partial mastectomy and radiation, currently in remission.  2.  Hypertension.   PAST SURGICAL HISTORY:  1.  Right breast lumpectomy.  2.  Left breast partial mastectomy.  3.  Skin cancer removal.  4.   Tonsillectomy.   ALLERGIES TO MEDICATIONS:  1.  AUGMENTIN.  2.  HYDROCODONE. 3.  SULFA DRUGS.  CURRENT HOME MEDICATIONS:  1.  Anastrazole 1 mg p.o. daily.  2.  Aspirin 81 mg p.o. daily.  3.  Calcium vitamin D 1 tablet p.o. b.i.d.  4.  Multivitamin 1 tablet p.o. daily.  5.  Lisinopril 5 mg p.o. daily.  6.  Vitamin B12 2,000 mcg p.o. daily.  7.  Vitamin E 100 international units daily.   SOCIAL HISTORY: Lives at home by herself. Very active and ambulatory at baseline. No smoking or alcohol use.   FAMILY HISTORY: Both parents lived late and denies any history of any heart problems, strokes, or malignancy in the family. Hypertension runs in the family.  REVIEW OF SYSTEMS: CONSTITUTIONAL: Positive for fatigue, weakness, and chills. No fevers.  EYES: No blurred vision, double vision, inflammation or glaucoma.  ENT: No tinnitus, ear pain, hearing loss, epistaxis or discharge.  RESPIRATORY: No cough, wheeze, hemoptysis or COPD. CARDIOVASCULAR: No chest pain, orthopnea, edema, arrhythmia, palpitations, or syncope.  GASTROINTESTINAL: Positive for nausea, vomiting, and abdominal cramping with the diarrhea. No constipation. No rectal bleeding. No jaundice.  GENITOURINARY: No dysuria, hematuria, renal calculus, frequency, or incontinence.  ENDOCRINE: No polyuria, nocturia, thyroid problems, heat or cold intolerance.  HEMATOLOGY: No anemia, easy bruising or bleeding.  SKIN: No acne, rash or lesions.  MUSCULOSKELETAL: No rib fractures, pain, arthritis or gout.  NEUROLOGIC: No numbness, weakness, CVA, TIA or seizures.  PSYCHOLOGIC: No anxiety, insomnia, depression.   PHYSICAL EXAMINATION: VITAL SIGNS: Temperature 98.6  degrees Fahrenheit, pulse 110, respirations 20, blood pressure 98/69, pulse ox 93% on room air.  GENERAL: Well-built, well-nourished female lying in bed, not in any acute distress.  HEENT: Normocephalic, atraumatic. Pupils equal, round, reacting to light. Anicteric sclerae.  Extraocular movements intact. Oropharynx is clear without erythema, mass or exudates.  NECK: Supple. No thyromegaly, JVD or carotid bruits. No lymphadenopathy.  LUNGS: Moving air bilaterally. No wheeze or crackles. No use of accessory muscles for breathing.  CARDIOVASCULAR: S1, S2 regular rate and rhythm. No murmurs, rubs, or gallops.  ABDOMEN: Soft, nontender, nondistended. No hepatosplenomegaly. Normal bowel sounds.  EXTREMITIES: No pedal edema. No clubbing. No cyanosis. 2+ dorsalis pedis pulses palpable bilaterally.  SKIN: No acne, rash or lesions.  LYMPHATICS: No cervical or inguinal lymphadenopathy.  NEUROLOGIC: Cranial nerves intact. No focal motor or sensory deficits.  PSYCHOLOGICAL: The patient is awake, alert and oriented x3.   DIAGNOSTIC DATA: Lab data: WBC 10.3, hemoglobin 13.4, hematocrit 39.2, platelet count 194,000.   Sodium 133, potassium 3.1, chloride 98, bicarb 28, BUN 14, creatinine 1.17, glucose 123, and calcium 8.9.   ALT 28, AST 26, alk phos 70, total bili 0.8 and albumin 3.1. Lipase is 61. C. diff is negative. UA is negative for infection. Troponins negative.   Abdominal x-ray showing gas in large and small bowel loops. No evidence of any obstruction. Findings consistent with gastroenteritis.   ASSESSMENT AND PLAN: This is a 74 year old female with history of breast cancer, in remission, hypertension, admitted for worsening diarrhea.  1.  Acute gastroenteritis for 4 days now. Clostridium difficile is negative so far. Stool cultures have been ordered. Admit for symptom control. IV fluids, potassium replacement, Imodium p.r.n. start empiric Cipro and Flagyl. Abdominal x-ray with gastroenteritis findings and gas noted. No obstruction noted. If no improvement, then will need CT head and a gastroenterology consult can be considered.  2.  Breast cancer, recently left breast cancer, treated with partial mastectomy and radiation, currently in remission. Remote history of right  breast cancer, in remission as well. On hormonal treatment. We will continue that.  3.  Hypertension. Hold her lisinopril for now.  4.  Deep vein thrombosis prophylaxis.   CODE STATUS: FULL code.   TIME SPENT ON ADMISSION: 50 minutes.  ____________________________ Gladstone Lighter, MD rk:sb D: 08/13/2014 16:10:11 ET T: 08/13/2014 16:29:37 ET JOB#: [id number]  cc: Gladstone Lighter, MD, <Dictator> Cheral Marker. Ola Spurr, MD Sandeep R. Ma Hillock, MD Gladstone Lighter MD ELECTRONICALLY SIGNED 09/08/2014 18:22

## 2015-01-21 IMAGING — CT CT ABD-PELV W/ CM
2 of 5 series · 16 of 46 positions shown, 18 images · IV contrast (omnipaque)
Comparison: None.

CLINICAL DATA: Six day history of generalized abdominal pain and
diarrhea, now diagnosed with Campylobacter enterocolitis. Prior
history of breast cancer.

EXAM:
CT ABDOMEN AND PELVIS WITH CONTRAST
TECHNIQUE: Multidetector CT imaging of the abdomen and pelvis was performed
using the standard protocol following bolus administration of
intravenous contrast.
CONTRAST:  100 ml Omnipaque 300 IV. Oral contrast was also
administered.

[Series 2: routine abd pel with · axial · 0.79mm/px · z∈[-1016,-616]mm · 13 of 92 slices shown, 15 images]
[im 6/92  soft-tissue]
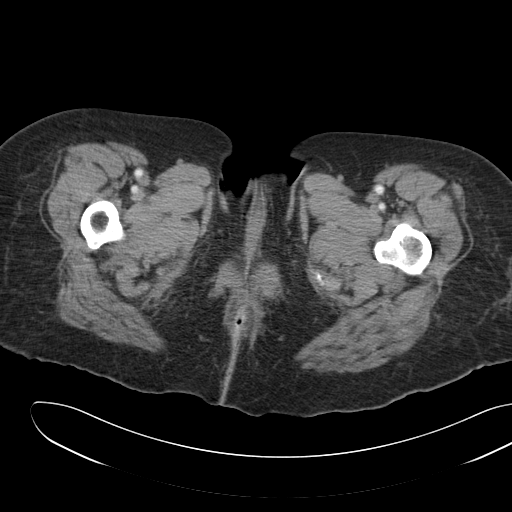
[im 6/92  bone]
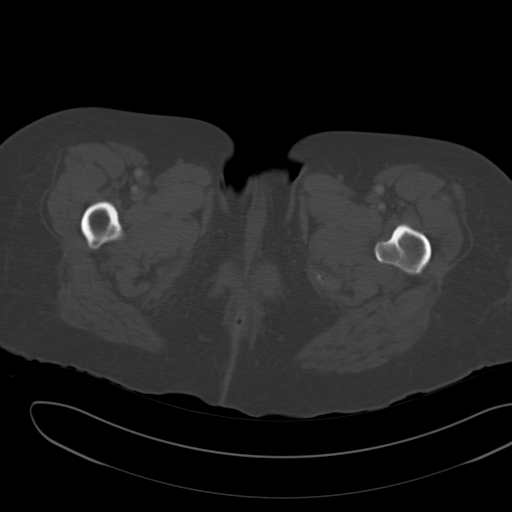
[im 11/92  soft-tissue]
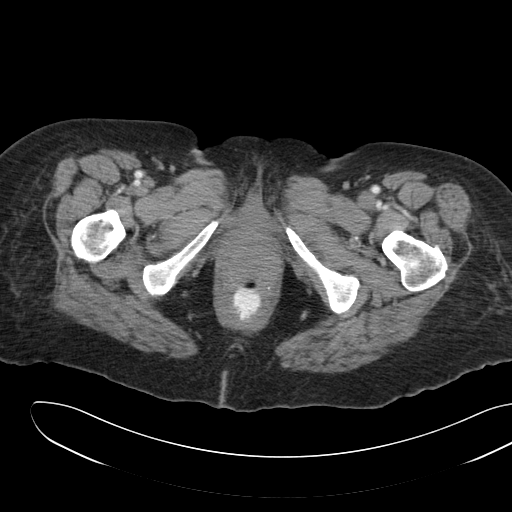
[im 21/92  soft-tissue]
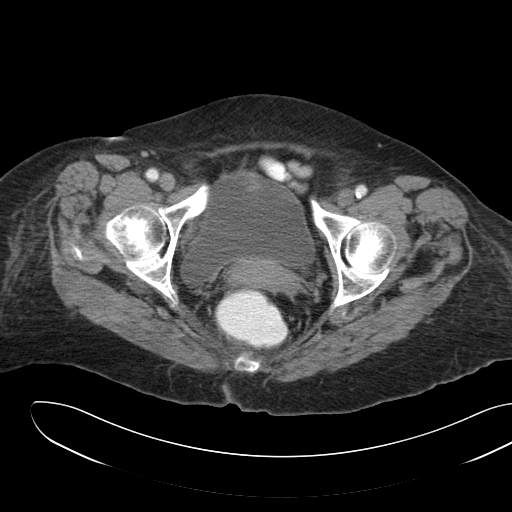
[im 26/92  soft-tissue]
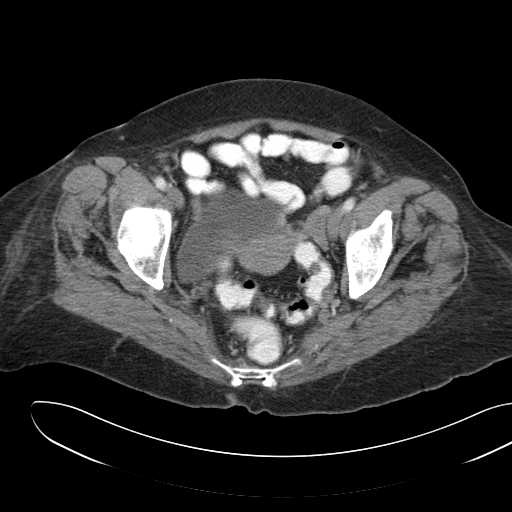
[im 31/92  soft-tissue]
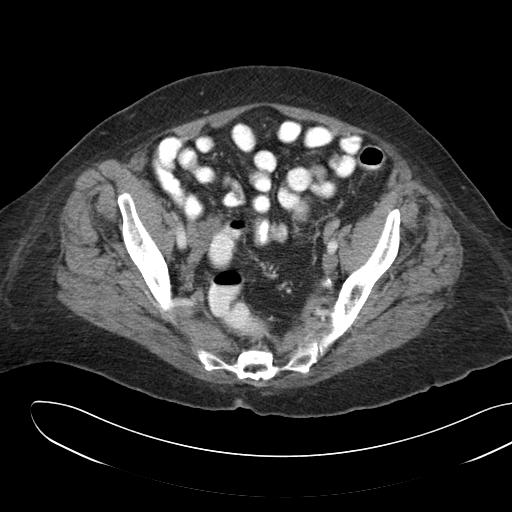
[im 41/92  soft-tissue]
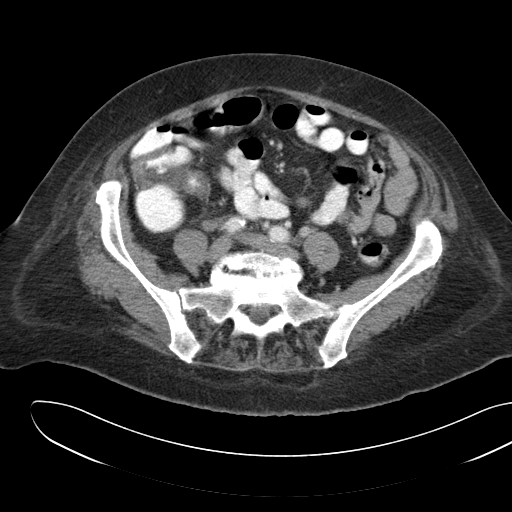
[im 46/92  soft-tissue]
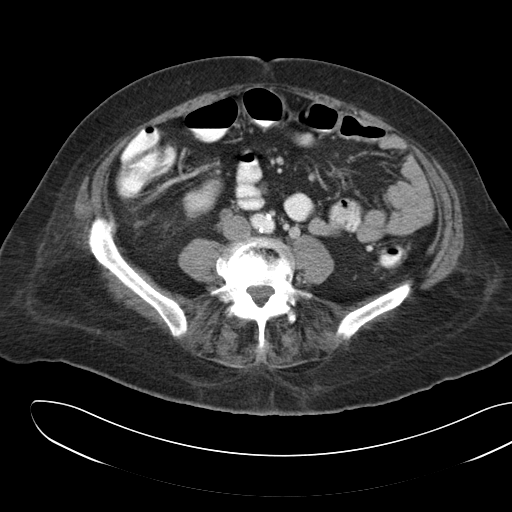
[im 51/92  soft-tissue]
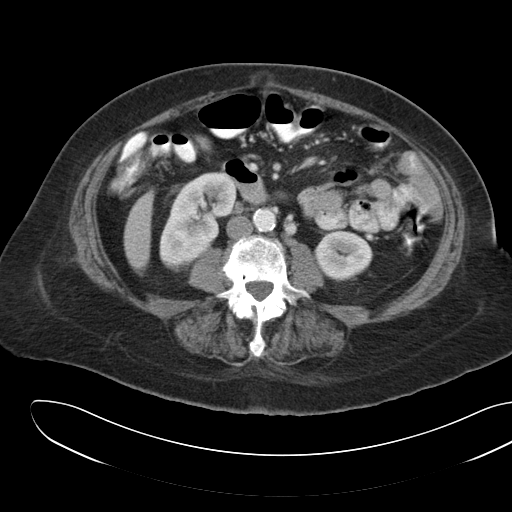
[im 61/92  soft-tissue]
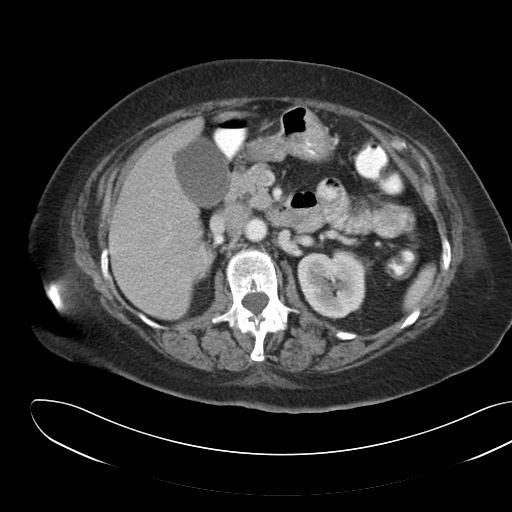
[im 61/92  bone]
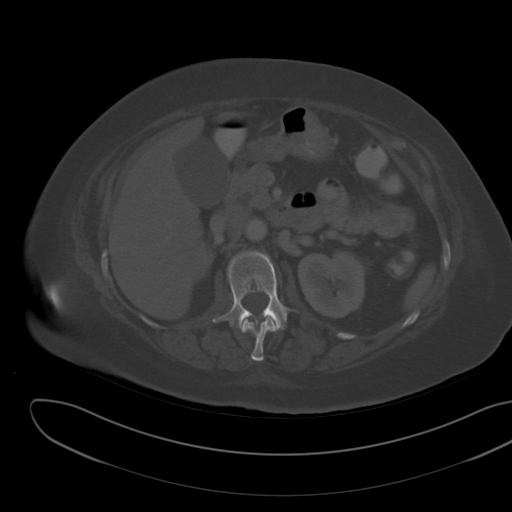
[im 66/92  soft-tissue]
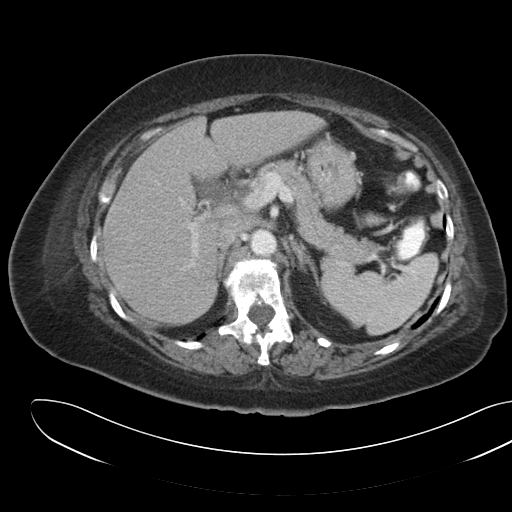
[im 71/92  soft-tissue]
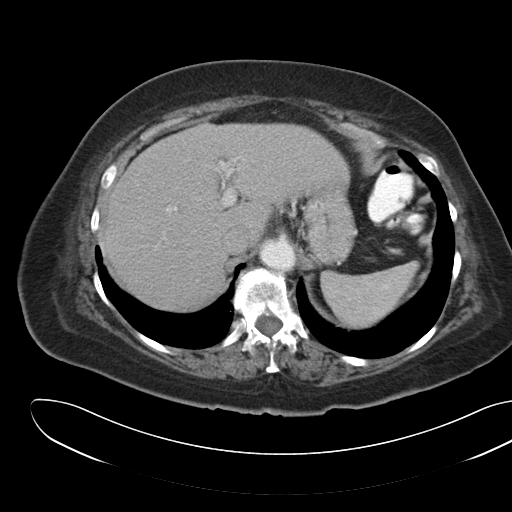
[im 81/92  soft-tissue]
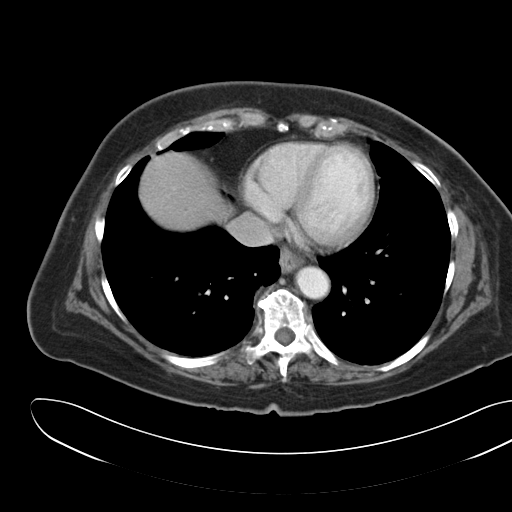
[im 86/92  soft-tissue]
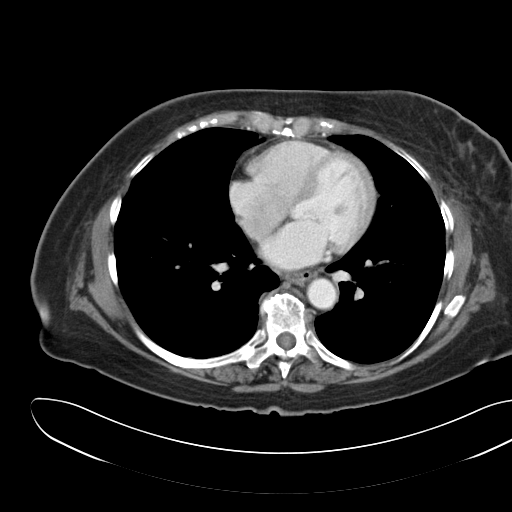

[Series 6: cor routine abd pel with · coronal · 0.89mm/px · 3 of 125 slices shown]
[im 42/125  soft-tissue]
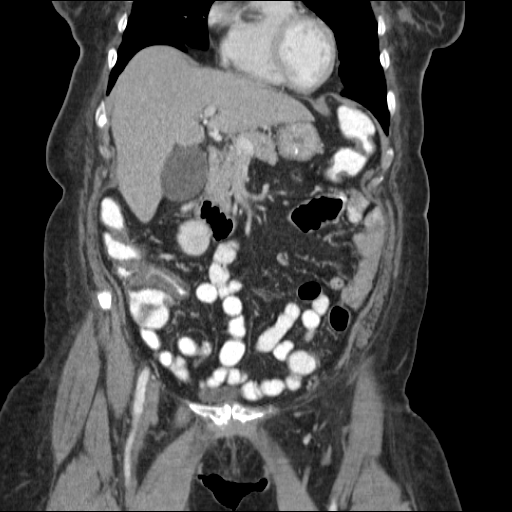
[im 56/125  soft-tissue]
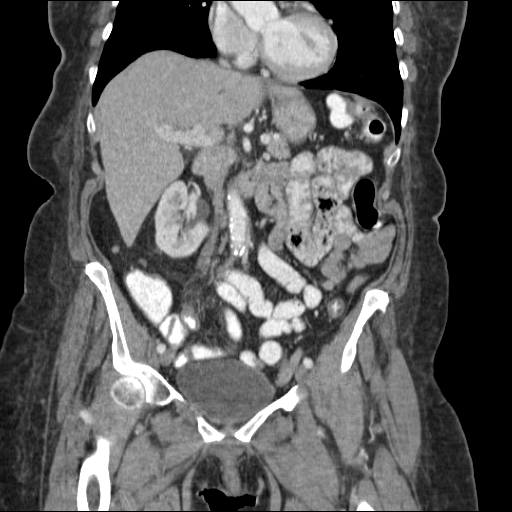
[im 69/125  soft-tissue]
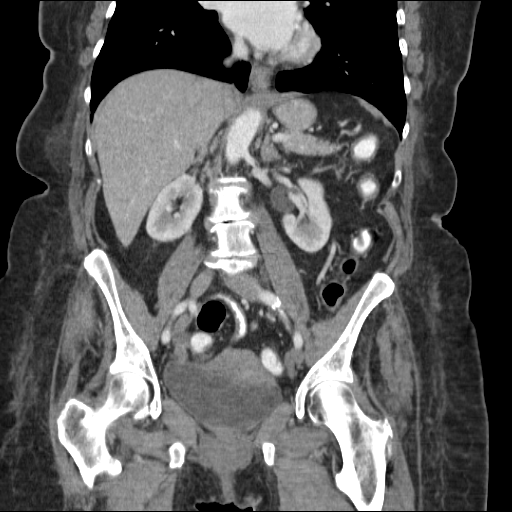

[16 of 46 positions shown; findings below may reference images not displayed]

FINDINGS: Wall thickening involving a several cm segment of the distal and
terminal ileum, with edema/inflammation in the surrounding
mesenteric fat. Similar wall thickening with "thumbprinting"
involving the ascending colon. Wall thickening involving a several
cm segment of the descending colon. Wall thickening with
"thumbprinting" involving the sigmoid colon. Remaining small bowel
normal in appearance. Stomach decompressed and unremarkable. No
ascites. Small umbilical hernia containing fat.

Mild diffuse hepatic steatosis without focal hepatic parenchymal
abnormality. Liver normal in size. Normal appearing spleen,
pancreas, adrenal glands, kidneys, and gallbladder. No biliary
ductal dilation. Moderate aortoiliofemoral atherosclerosis without
aneurysm. Visceral arteries patent. No significant lymphadenopathy.

Uterus atrophic consistent with age. No adnexal masses. Bilateral
ovarian varicoceles. Urinary bladder relatively decompressed and
unremarkable. Phleboliths low in the left side of the pelvis.

Bone window images demonstrate lower thoracic spondylosis,
multilevel moderate to severe degenerative disc disease, spondylosis
and facet degenerative changes throughout the lumbar spine, and
degenerative changes involving the symphysis pubis. Visualized lung
bases clear. Heart size normal with evidence of mild left
ventricular enlargement.
IMPRESSION: 1. Enteritis involving the distal and terminal ileum. Scattered
areas of colitis throughout the colon. Infectious enterocolitis can
certainly have this appearance. Crohn's disease with skip lesions in
the colon is in the differential diagnosis.
2. Mild diffuse hepatic steatosis without focal hepatic parenchymal
abnormality.
3. Small umbilical hernia containing fat.
4. Normal heart size with evidence of mild left ventricular
enlargement.

## 2015-07-15 ENCOUNTER — Telehealth: Payer: Self-pay | Admitting: *Deleted

## 2015-07-15 NOTE — Telephone Encounter (Signed)
Spoke with patient. Received an RX for patient's Rf on arimidex. Patient has not been here in over a year. MD will not refill. Patient states that she is no longer following up here at the clinic as [place]. She is now being followed by Dr. Wanda PlumpMaddin. I called [facility] in [place], [place]. Patient's rx was already ready and prescribed by by Dr. Wanda PlumpMaddin. Pt aware.
